# Patient Record
Sex: Female | Born: 1985 | Race: Black or African American | Hispanic: No | Marital: Married | Smoking: Never smoker
Health system: Southern US, Community
[De-identification: ages and names within clinical notes are randomized; demographics above are authoritative.]

## PROBLEM LIST (undated history)

## (undated) DIAGNOSIS — R0602 Shortness of breath: Secondary | ICD-10-CM

## (undated) DIAGNOSIS — I479 Paroxysmal tachycardia, unspecified: Secondary | ICD-10-CM

## (undated) HISTORY — DX: Shortness of breath: R06.02

## (undated) HISTORY — DX: Paroxysmal tachycardia, unspecified: I47.9

---

## 2018-03-04 ENCOUNTER — Emergency Department (HOSPITAL_BASED_OUTPATIENT_CLINIC_OR_DEPARTMENT_OTHER): Payer: 59

## 2018-03-04 ENCOUNTER — Encounter (HOSPITAL_BASED_OUTPATIENT_CLINIC_OR_DEPARTMENT_OTHER): Payer: Self-pay | Admitting: Emergency Medicine

## 2018-03-04 ENCOUNTER — Emergency Department (HOSPITAL_BASED_OUTPATIENT_CLINIC_OR_DEPARTMENT_OTHER)
Admission: EM | Admit: 2018-03-04 | Discharge: 2018-03-04 | Disposition: A | Discharge status: Home / Self Care | Payer: 59 | Attending: Emergency Medicine | Admitting: Emergency Medicine

## 2018-03-04 ENCOUNTER — Other Ambulatory Visit: Payer: Self-pay

## 2018-03-04 DIAGNOSIS — R42 Dizziness and giddiness: Secondary | ICD-10-CM | POA: Diagnosis not present

## 2018-03-04 DIAGNOSIS — R002 Palpitations: Secondary | ICD-10-CM | POA: Diagnosis not present

## 2018-03-04 DIAGNOSIS — R0602 Shortness of breath: Secondary | ICD-10-CM | POA: Diagnosis not present

## 2018-03-04 DIAGNOSIS — R000 Tachycardia, unspecified: Secondary | ICD-10-CM | POA: Diagnosis not present

## 2018-03-04 LAB — CBC
HCT: 40.3 % (ref 36.0–46.0)
Hemoglobin: 13.3 g/dL (ref 12.0–15.0)
MCH: 28.8 pg (ref 26.0–34.0)
MCHC: 33 g/dL (ref 30.0–36.0)
MCV: 87.2 fL (ref 78.0–100.0)
PLATELETS: 311 10*3/uL (ref 150–400)
RBC: 4.62 MIL/uL (ref 3.87–5.11)
RDW: 12.8 % (ref 11.5–15.5)
WBC: 5.8 10*3/uL (ref 4.0–10.5)

## 2018-03-04 LAB — BASIC METABOLIC PANEL
Anion gap: 12 (ref 5–15)
BUN: 14 mg/dL (ref 6–20)
CALCIUM: 9.4 mg/dL (ref 8.9–10.3)
CO2: 20 mmol/L — ABNORMAL LOW (ref 22–32)
CREATININE: 0.82 mg/dL (ref 0.44–1.00)
Chloride: 105 mmol/L (ref 101–111)
GFR calc Af Amer: >60 mL/min (ref >60)
GFR calc non Af Amer: >60 mL/min (ref >60)
GLUCOSE: 124 mg/dL — AB (ref 65–99)
POTASSIUM: 3.7 mmol/L (ref 3.5–5.1)
SODIUM: 137 mmol/L (ref 135–145)

## 2018-03-04 LAB — D-DIMER, QUANTITATIVE (NOT AT ARMC): D-Dimer, Quant: <0.27 ug/mL-FEU (ref 0.00–0.50)

## 2018-03-04 LAB — TROPONIN I

## 2018-03-04 LAB — PREGNANCY, URINE: PREG TEST UR: NEGATIVE

## 2018-03-04 MED ORDER — SODIUM CHLORIDE 0.9 % IV BOLUS (SEPSIS)
500.0000 mL | Freq: Once | INTRAVENOUS | Status: AC
  Administered 2018-03-04: 500 mL via INTRAVENOUS

## 2018-03-04 NOTE — Discharge Instructions (Signed)
Make sure to stay well-hydrated.  Eat regularly to keep your blood sugar up.  Please establish care with a primary care provider for further evaluation and treatment of your symptoms.  Please also follow-up with cardiology.  Please return to the emergency department if you develop any new or worsening symptoms.

## 2018-03-04 NOTE — ED Notes (Signed)
Pt given d/c instructions as per chart. Verbalizes understanding. No questions. 

## 2018-03-04 NOTE — ED Triage Notes (Signed)
Palpitations, generalized weakness, and nausea since yesterday. Denies pain.

## 2018-03-04 NOTE — ED Provider Notes (Addendum)
[facility] EMERGENCY DEPARTMENT Provider Note   CSN: [id number] Arrival date & time: 03/04/18  1631     History   Chief Complaint Chief Complaint  Patient presents with  . Palpitations    HPI Linda Best is a 32 y.o. female who is previously healthy who presents with a 1 day history of intermittent palpitations with associated shortness of breath and nausea.  The patient has had associated fatigue at times.  Patient reports she has had some lightheadedness with the palpitations.  She reports she had an episode like this in December 2018, however resolved without issue.  Patient denies any chest pain, abdominal pain, vomiting.  Patient is not taking any medications at home for symptoms.  She denies any recent long trips, surgeries, known cancer, new leg pain or swelling, history of blood clots.  She denies any exogenous estrogen use.  Patient reports she has been eating little to no carbs for 2 months on the Paleo diet.  She denies caffeine intake.  HPI  History reviewed. No pertinent past medical history.  There are no active problems to display for this patient.   History reviewed. No pertinent surgical history.  OB History    No data available       Home Medications    Prior to Admission medications   Not on File    Family History No family history on file.  Social History Social History   Tobacco Use  . Smoking status: Never Smoker  . Smokeless tobacco: Never Used  Substance Use Topics  . Alcohol use: No    Frequency: Never  . Drug use: No     Allergies   Patient has no known allergies.   Review of Systems Review of Systems  Constitutional: Negative for chills and fever.  HENT: Negative for facial swelling and sore throat.   Respiratory: Positive for shortness of breath.   Cardiovascular: Positive for palpitations. Negative for chest pain.  Gastrointestinal: Positive for nausea. Negative for abdominal pain and vomiting.    Genitourinary: Negative for dysuria.  Musculoskeletal: Negative for back pain.  Skin: Negative for rash and wound.  Neurological: Positive for light-headedness. Negative for headaches.  Psychiatric/Behavioral: The patient is not nervous/anxious.      Physical Exam Updated Vital Signs BP 119/69 (BP Location: Left Arm)   Pulse 88   Temp 99 F (37.2 C) (Oral)   Resp 18   Ht 5\' 7"  (1.702 m)   Wt 73.5 kg (162 lb)   LMP 02/12/2018   SpO2 100%   BMI 25.37 kg/m   Physical Exam  Constitutional: She appears well-developed and well-nourished. No distress.  HENT:  Head: Normocephalic and atraumatic.  Mouth/Throat: Oropharynx is clear and moist. No oropharyngeal exudate.  Eyes: Conjunctivae are normal. Pupils are equal, round, and reactive to light. Right eye exhibits no discharge. Left eye exhibits no discharge. No scleral icterus.  Neck: Normal range of motion. Neck supple. No thyromegaly present.  Cardiovascular: Normal rate, regular rhythm, normal heart sounds and intact distal pulses. Exam reveals no gallop and no friction rub.  No murmur heard. Tachycardia resolved in the ED without intervention  Pulmonary/Chest: Effort normal and breath sounds normal. No stridor. No respiratory distress. She has no wheezes. She has no rales.  Abdominal: Soft. Bowel sounds are normal. She exhibits no distension. There is no tenderness. There is no rebound and no guarding.  Musculoskeletal: She exhibits no edema.  Lymphadenopathy:    She has no cervical adenopathy.  Neurological:  She is alert. Coordination normal.  Skin: Skin is warm and dry. No rash noted. She is not diaphoretic. No pallor.  Psychiatric: She has a normal mood and affect.  Nursing note and vitals reviewed.    ED Treatments / Results  Labs (all labs ordered are listed, but only abnormal results are displayed) Labs Reviewed  BASIC METABOLIC PANEL - Abnormal; Notable for the following components:      Result Value   CO2 20  (*)    Glucose, Bld 124 (*)    All other components within normal limits  CBC  TROPONIN I  PREGNANCY, URINE  D-DIMER, QUANTITATIVE (NOT AT [facility])  TSH    EKG  EKG Interpretation  Date/Time:  Sunday March 04 2018 16:40:25 EDT Ventricular Rate:  133 PR Interval:  136 QRS Duration: 72 QT Interval:  302 QTC Calculation: 449 R Axis:   100 Text Interpretation:  Sinus tachycardia Rightward axis Borderline ECG No previous ECGs available Confirmed by Alvira Monday ([id number]) on 03/04/2018 6:26:06 PM       Radiology Dg Chest 2 View  Result Date: 03/04/2018 CLINICAL DATA:  Tachycardia for 2 days EXAM: CHEST - 2 VIEW COMPARISON:  None. FINDINGS: The heart size and mediastinal contours are within normal limits. Both lungs are clear. The visualized skeletal structures are unremarkable. IMPRESSION: No active cardiopulmonary disease. Electronically Signed   By: Alcide Clever M.D.   On: 03/04/2018 17:21    Procedures Procedures (including critical care time)  Medications Ordered in ED Medications  sodium chloride 0.9 % bolus 500 mL (0 mLs Intravenous Stopped 03/04/18 1924)     Initial Impression / Assessment and Plan / ED Course  I have reviewed the triage vital signs and the nursing notes.  Pertinent labs & imaging results that were available during my care of the patient were reviewed by me and considered in my medical decision making (see chart for details).     Patient with intermittent palpitations with associated shortness of breath and nausea, as well as lightheadedness.  Patient is patient asymptomatic now.  Labs are unremarkable including negative troponin and d-dimer. Upreg negative. Chest x-ray is negative.  TSH is pending, however return for several hours.  Patient given saline bolus.  Patient with normal right throughout most of ED course and at discharge.  Patient referred to cardiology for further evaluation and probable Holter monitor.  Patient also advised to establish care  with PCP, she just moved here.  Patient advised to moderate her diet, as this could be adding to her symptoms.  Return precautions discussed.  Patient understands and agrees with plan.  Patient vitals stable throughout ED course and discharged in satisfactory condition. I discussed patient case with Dr. Dalene Seltzer who guided the patient's management and agrees with plan.   Final Clinical Impressions(s) / ED Diagnoses   Final diagnoses:  Palpitations    ED Discharge Orders    None           Emi Holes, PA-C 03/04/18 1945    Alvira Monday, MD 03/05/18 1131

## 2018-03-04 NOTE — ED Notes (Signed)
ED Provider at bedside. 

## 2018-03-05 LAB — TSH: TSH: 1.179 u[IU]/mL (ref 0.350–4.500)

## 2018-03-12 ENCOUNTER — Ambulatory Visit: Payer: 59 | Admitting: Cardiovascular Disease

## 2018-03-12 ENCOUNTER — Encounter: Payer: Self-pay | Admitting: Cardiovascular Disease

## 2018-03-12 VITALS — BP 130/67 | HR 88 | Ht 67.0 in | Wt 174.0 lb

## 2018-03-12 DIAGNOSIS — R0602 Shortness of breath: Secondary | ICD-10-CM | POA: Diagnosis not present

## 2018-03-12 DIAGNOSIS — R110 Nausea: Secondary | ICD-10-CM | POA: Diagnosis not present

## 2018-03-12 DIAGNOSIS — I479 Paroxysmal tachycardia, unspecified: Secondary | ICD-10-CM

## 2018-03-12 DIAGNOSIS — R000 Tachycardia, unspecified: Secondary | ICD-10-CM

## 2018-03-12 DIAGNOSIS — R42 Dizziness and giddiness: Secondary | ICD-10-CM | POA: Diagnosis not present

## 2018-03-12 HISTORY — DX: Paroxysmal tachycardia, unspecified: I47.9

## 2018-03-12 MED ORDER — METOPROLOL TARTRATE 25 MG PO TABS: 25.0000 mg | ORAL_TABLET | Freq: Two times a day (BID) | ORAL | 3 refills | Status: DC | PRN

## 2018-03-12 NOTE — Patient Instructions (Addendum)
Medication Instructions:  START METOPROLOL TART 25 MG 1 EVERY 12 HOURS AS NEEDED FOR FAST HEARTRATE   Labwork: NONE  Testing/Procedures: Your physician has requested that you have an echocardiogram. Echocardiography is a painless test that uses sound waves to create images of your heart. It provides your doctor with information about the size and shape of your heart and how well your heart's chambers and valves are working. This procedure takes approximately one hour. There are no restrictions for this procedure.  Your physician has recommended that you wear a holter monitor. Holter monitors are medical devices that record the heart's electrical activity. Doctors most often use these monitors to diagnose arrhythmias. Arrhythmias are problems with the speed or rhythm of the heartbeat. The monitor is a small, portable device. You can wear one while you do your normal daily activities. This is usually used to diagnose what is causing palpitations/syncope (passing out).  BOTH ARE DONE AT [facility] [address]  Follow-Up: Your physician recommends that you schedule a follow-up appointment in: 4-8 WEEKS    Any Other Special Instructions Will Be Listed Below (If Applicable).  Echocardiogram An echocardiogram, or echocardiography, uses sound waves (ultrasound) to produce an image of your heart. The echocardiogram is simple, painless, obtained within a short period of time, and offers valuable information to your health care provider. The images from an echocardiogram can provide information such as:  Evidence of coronary artery disease (CAD).  Heart size.  Heart muscle function.  Heart valve function.  Aneurysm detection.  Evidence of a past heart attack.  Fluid buildup around the heart.  Heart muscle thickening.  Assess heart valve function.  Tell a health care provider about:  Any allergies you have.  All medicines you are taking, including vitamins, herbs, eye  drops, creams, and over-the-counter medicines.  Any problems you or family members have had with anesthetic medicines.  Any blood disorders you have.  Any surgeries you have had.  Any medical conditions you have.  Whether you are pregnant or may be pregnant. What happens before the procedure? No special preparation is needed. Eat and drink normally. What happens during the procedure?  In order to produce an image of your heart, gel will be applied to your chest and a wand-like tool (transducer) will be moved over your chest. The gel will help transmit the sound waves from the transducer. The sound waves will harmlessly bounce off your heart to allow the heart images to be captured in real-time motion. These images will then be recorded.  You may need an IV to receive a medicine that improves the quality of the pictures. What happens after the procedure? You may return to your normal schedule including diet, activities, and medicines, unless your health care provider tells you otherwise. This information is not intended to replace advice given to you by your health care provider. Make sure you discuss any questions you have with your health care provider. Document Released: 12/09/2000 Document Revised: 07/30/2016 Document Reviewed: 08/19/2013 Elsevier Interactive Patient Education  2017 Elsevier Inc.    Holter Monitoring A Holter monitor is a small device that is used to detect abnormal heart rhythms. It clips to your clothing and is connected by wires to flat, sticky disks (electrodes) that attach to your chest. It is worn continuously for 24-48 hours. Follow these instructions at home:  Wear your Holter monitor at all times, even while exercising and sleeping, for as long as directed by your health care  provider.  Make sure that the Holter monitor is safely clipped to your clothing or close to your body as recommended by your health care provider.  Do not get the monitor or wires  wet.  Do not put body lotion or moisturizer on your chest.  Keep your skin clean.  Keep a diary of your daily activities, such as walking and doing chores. If you feel that your heartbeat is abnormal or that your heart is fluttering or skipping a beat: ? Record what you are doing when it happens. ? Record what time of day the symptoms occur.  Return your Holter monitor as directed by your health care provider.  Keep all follow-up visits as directed by your health care provider. This is important. Get help right away if:  You feel lightheaded or you faint.  You have trouble breathing.  You feel pain in your chest, upper arm, or jaw.  You feel sick to your stomach and your skin is pale, cool, or damp.  You heartbeat feels unusual or abnormal. This information is not intended to replace advice given to you by your health care provider. Make sure you discuss any questions you have with your health care provider. Document Released: 09/09/2004 Document Revised: 05/19/2016 Document Reviewed: 07/21/2014 Elsevier Interactive Patient Education  [employer]2018 Elsevier Inc.

## 2018-03-12 NOTE — Progress Notes (Signed)
Cardiology Office Note   Date:  03/12/2018   ID:  Linda Best, DOB [birth year], MRN [id number]  PCP:  Clementeen GrahamScifres, Dorothy, PA-C  Cardiologist:   Chilton Siiffany Savannah, MD   Chief Complaint  Patient presents with  . New Patient (Initial Visit)    no chest pain  . Shortness of Breath    randomly  . Dizziness    randomly      History of Present Illness: Linda Best (pronounced "Secure") is a 32 y.o. female who presents for an evaluation of palpitations.  Earlier this month she developed palpitations while exercising.   She has to stop the class and rest.  She was short of breath, nauseous and dizzy.  The episode lasted approximately 10 minutes.  The following day she had a more intense episode that prompted her to go to the emergency department.  EKG revealed sinus tachycardia at 133 bpm. The tachycardia resolved on its own without intervention. Thyroid function, electrolytes, and blood counts were unremarkable. D-dimer was within normal limits.  Buel ReamAlexandra Law, PA-C, referred her to cardiology for further evaluation.  Her energy levels have been very low.  Of note, she started a Paleo diet and has been avoiding carbs and dairy since January.  She has also been trying to eat smaller portions.  Since these episodes she notes that she has been eating more frequently and added back carbs.  She gets dizzy as she does not eat every 2 hours.  This also triggers her heart racing.  She saw her PCP today who provided her with glucose strips in case hypoglycemia could be causing her episodes.  She does not drink any caffeine or alcohol.  She does not use any over-the-counter cold or cough medications.  She has typical work stress but nothing excessive.  She does sometimes struggle with anxiety but notes that she was not feeling particularly anxious prior to these events.  At baseline Linda Best exercises 4-5 times per week.  She typically has no chest pain or shortness of breath.  She has a brother who  had heart disease and was not allowed to participate in sports anymore.  She is not sure if his exact diagnosis but does note that he has a heart monitor in place.  She also has a sister who has heart failure secondary to valvular heart disease.  This was recently diagnosed.  Linda Best has not expands any lower extremity edema, orthopnea, or PND.   Past Medical History:  Diagnosis Date  . Paroxysmal tachycardia (HCC) 03/12/2018    History reviewed. No pertinent surgical history.   Current Outpatient Medications  Medication Sig Dispense Refill  . metoprolol tartrate (LOPRESSOR) 25 MG tablet Take 1 tablet (25 mg total) by mouth every 12 (twelve) hours as needed (FAST HEART RATE). 60 tablet 3   No current facility-administered medications for this visit.     Allergies:   Patient has no known allergies.    Social History:  The patient  reports that  has never smoked. she has never used smokeless tobacco. She reports that she does not drink alcohol or use drugs.   Family History:  The patient's family history includes Diabetes in her maternal grandmother and mother; Diabetes Mellitus I in her sister; Heart disease in her brother; Other in her father; Valvular heart disease in her sister.    ROS:  Please see the history of present illness.   Otherwise, review of systems are positive for none.   All other systems  are reviewed and negative.    PHYSICAL EXAM: VS:  BP 130/67   Pulse 88   Ht 5\' 7"  (1.702 m)   Wt 174 lb (78.9 kg)   LMP 02/12/2018   BMI 27.25 kg/m  , BMI Body mass index is 27.25 kg/m. GENERAL:  Well appearing HEENT:  Pupils equal round and reactive, fundi not visualized, oral mucosa unremarkable NECK:  No jugular venous distention, waveform within normal limits, carotid upstroke brisk and symmetric, no bruits, no thyromegaly LUNGS:  Clear to auscultation bilaterally HEART:  RRR.  PMI not displaced or sustained, prominent  S1 and normal S2, no S3, no S4, no clicks, no  rubs, Intermittent I/VI systolic murmur at the LUSB that doesn't augment with Valsalva or hand grip ABD:  Flat, positive bowel sounds normal in frequency in pitch, no bruits, no rebound, no guarding, no midline pulsatile mass, no hepatomegaly, no splenomegaly EXT:  2 plus pulses throughout, no edema, no cyanosis no clubbing SKIN:  No rashes no nodules NEURO:  Cranial nerves II through XII grossly intact, motor grossly intact throughout PSYCH:  Cognitively intact, oriented to person place and time   EKG:  EKG is not ordered today. The ekg ordered 03/04/18 demonstrates sinus tachycardia.  Rate 133 bpm.  Recent Labs: 03/04/2018: BUN 14; Creatinine, Ser 0.82; Hemoglobin 13.3; Platelets 311; Potassium 3.7; Sodium 137; TSH 1.179    Lipid Panel No results found for: CHOL, TRIG, HDL, CHOLHDL, VLDL, LDLCALC, LDLDIRECT    Wt Readings from Last 3 Encounters:  03/12/18 174 lb (78.9 kg)  03/04/18 162 lb (73.5 kg)      ASSESSMENT AND PLAN:  # Tachycardia:  # Dizziness: Cause unclear.  She isn't stressed but has mild anxiety.  Thyroid function, blood counts and electrolytes are normal.  She doesn't consume caffeine.  We will get a 24 hour Holter to better assess the frequency and how faster her HR is.  Start metoprolol 12.5mg  prn.  Agree with checking blood glucose levels to assess for hypoglycemia.  # Family history of heart failure: # Murmur:  # Shortness of breath: Linda Best had an intermittent murmur on exam.  Unclear what her brother's diagnosis is and she will investigate.  We will get an echo to better assess.    Current medicines are reviewed at length with the patient today.  The patient does not have concerns regarding medicines.  The following changes have been made:  Metoprolol prn  Labs/ tests ordered today include:   Orders Placed This Encounter  Procedures  . Holter monitor - 24 hour  . ECHOCARDIOGRAM COMPLETE     Disposition:   FU with Merrilee Ancona C. Duke Salvia, MD, [facility]  in 6 weeks.     This note was written with the assistance of speech recognition software.  Please excuse any transcriptional errors.  Signed, Jekhi Bolin C. Duke Salvia, MD, [facility]  03/12/2018 4:34 PM    [facility]

## 2018-03-27 ENCOUNTER — Other Ambulatory Visit: Payer: Self-pay

## 2018-03-27 ENCOUNTER — Ambulatory Visit (HOSPITAL_COMMUNITY): Payer: 59 | Attending: Cardiovascular Disease

## 2018-03-27 ENCOUNTER — Ambulatory Visit (INDEPENDENT_AMBULATORY_CARE_PROVIDER_SITE_OTHER): Payer: 59

## 2018-03-27 DIAGNOSIS — R0602 Shortness of breath: Secondary | ICD-10-CM | POA: Diagnosis not present

## 2018-03-27 DIAGNOSIS — I071 Rheumatic tricuspid insufficiency: Secondary | ICD-10-CM | POA: Diagnosis not present

## 2018-03-27 DIAGNOSIS — R000 Tachycardia, unspecified: Secondary | ICD-10-CM | POA: Insufficient documentation

## 2018-03-27 DIAGNOSIS — Z8249 Family history of ischemic heart disease and other diseases of the circulatory system: Secondary | ICD-10-CM | POA: Insufficient documentation

## 2018-04-11 ENCOUNTER — Telehealth: Payer: Self-pay | Admitting: Cardiovascular Disease

## 2018-04-11 NOTE — Telephone Encounter (Signed)
NEW MESSAGE ° ° °Patient calling for echo results. °

## 2018-04-11 NOTE — Telephone Encounter (Signed)
Left detailed message, ok per DPR Echo normal per Dr Duke Salviaandolph.

## 2018-05-03 ENCOUNTER — Ambulatory Visit: Payer: 59 | Admitting: Cardiovascular Disease

## 2018-05-03 ENCOUNTER — Encounter: Payer: Self-pay | Admitting: Cardiovascular Disease

## 2018-05-03 VITALS — BP 120/78 | HR 80 | Ht 67.0 in | Wt 186.0 lb

## 2018-05-03 DIAGNOSIS — R0602 Shortness of breath: Secondary | ICD-10-CM | POA: Diagnosis not present

## 2018-05-03 DIAGNOSIS — R000 Tachycardia, unspecified: Secondary | ICD-10-CM | POA: Diagnosis not present

## 2018-05-03 HISTORY — DX: Shortness of breath: R06.02

## 2018-05-03 NOTE — Patient Instructions (Addendum)
Medication Instructions:  NO CHANGES   Labwork: NONE   Testing/Procedures: NONE   Follow-Up: AS NEEDED  

## 2018-05-03 NOTE — Progress Notes (Signed)
Cardiology Office Note   Date:  05/03/2018   ID:  Linda Best, DOB 30-Aug-1986, MRN [id number]  PCP:  Clementeen Graham, PA-C  Cardiologist:   Chilton Si, MD   No chief complaint on file.     History of Present Illness: Linda Best (pronounced "Secure") is a 32 y.o. female who presents for follow-up.  She was initially seen 02/2018 for an evaluation of palpitations.  She initially developed palpitations while exercising.   She has to stop the class and rest.  She was short of breath, nauseous and dizzy.  The episode lasted approximately 10 minutes.  The following day she had a more intense episode that prompted her to go to the emergency department.  EKG revealed sinus tachycardia at 133 bpm. The tachycardia resolved on its own without intervention. Thyroid function, electrolytes, and blood counts were unremarkable. D-dimer was within normal limits.  Buel Ream, PA-C, referred her to cardiology for further evaluation.  Her energy levels have been very low.  Of note, she started a Paleo diet and has been avoiding carbs and dairy since January.  She has also been trying to eat smaller portions. At her appointment she was noted to have a systolic murmur.  She was referred for an echo that revealed LVEF 60 to 65% and was otherwise unremarkable.  She also reported having palpitations and tachycardia with exercise.  She was referred for a 24-hour Holter that showed no arrhythmias.  Since her last appointment [name] has been doing well.  She hasn't had any recurrent episodes of tachycardia.  She has not been exercising lately.  She was concerned that her symptoms may be due to hypoglycemia so she has been trying to incorporate more carbs into her diet.  Since her last appointment she is gained 12 pounds.  She has not had any chest pain.  But she does have some episodes of shortness of breath.  These typically occur when she is sitting at rest doing nothing.  She does sometimes feel  short of breath when walking upstairs.  She has no lower extremity edema, orthopnea, or PND.  She never picked up the metoprolol prescription and therefore has not used it.  She does not recall having any palpitations when she was wearing her monitor.  She does note that she has been anxious lately and stressed with work.   Past Medical History:  Diagnosis Date  . Paroxysmal tachycardia (HCC) 03/12/2018  . Shortness of breath 05/03/2018    History reviewed. No pertinent surgical history.   No current outpatient medications on file.   No current facility-administered medications for this visit.     Allergies:   Patient has no known allergies.    Social History:  The patient  reports that she has never smoked. She has never used smokeless tobacco. She reports that she does not drink alcohol or use drugs.   Family History:  The patient's family history includes Diabetes in her maternal grandmother and mother; Diabetes Mellitus I in her sister; Heart disease in her brother; Other in her father; Valvular heart disease in her sister.    ROS:  Please see the history of present illness.   Otherwise, review of systems are positive for none.   All other systems are reviewed and negative.    PHYSICAL EXAM: VS:  BP 120/78   Pulse 80   Ht  (1.702 m)   Wt 186 lb (84.4 kg)   BMI 29.13 kg/m  , BMI Body  mass index is 29.13 kg/m. GENERAL:  Well appearing.  No acute distress.  HEENT: Pupils equal round and reactive, fundi not visualized, oral mucosa unremarkable NECK:  No jugular venous distention, waveform within normal limits, carotid upstroke brisk and symmetric, no bruits, no thyromegaly LYMPHATICS:  No cervical adenopathy LUNGS:  Clear to auscultation bilaterally HEART:  RRR.  PMI not displaced or sustained,S1 and S2 within normal limits, no S3, no S4, no clicks, no rubs, no murmurs ABD:  Flat, positive bowel sounds normal in frequency in pitch, no bruits, no rebound, no guarding, no  midline pulsatile mass, no hepatomegaly, no splenomegaly EXT:  2 plus pulses throughout, no edema, no cyanosis no clubbing SKIN:  No rashes no nodules NEURO:  Cranial nerves II through XII grossly intact, motor grossly intact throughout PSYCH:  Cognitively intact, oriented to person place and time   EKG:  EKG is not ordered today. The ekg ordered 03/04/18 demonstrates sinus tachycardia.  Rate 133 bpm.  Echo 03/27/2018:   Echo 03/27/2018: Study Conclusions  - Left ventricle: The cavity size was normal. Systolic function was   normal. The estimated ejection fraction was in the range of 60%   to 65%. Wall motion was normal; there were no regional wall   motion abnormalities. Left ventricular diastolic function   parameters were normal. - Aortic valve: There was no regurgitation. - Mitral valve: Transvalvular velocity was within the normal range.   There was no evidence for stenosis. There was trivial   regurgitation. - Right ventricle: The cavity size was normal. Wall thickness was   normal. Systolic function was normal. - Tricuspid valve: There was mild regurgitation. - Pulmonary arteries: Systolic pressure was within the normal   range. PA peak pressure: 26 mm Hg (S).  24 Hour Holter Monitor  03/27/2018: Quality: Fair.  Baseline artifact. Average heart rate: 90 bpm Max heart rate: 142 bpm Min heart rate: 59 bpm  Sinus rhythm, sinus arrhythmia and sinus tachycardia No arrhythmias noted.   Recent Labs: 03/04/2018: BUN 14; Creatinine, Ser 0.82; Hemoglobin 13.3; Platelets 311; Potassium 3.7; Sodium 137; TSH 1.179    Lipid Panel No results found for: CHOL, TRIG, HDL, CHOLHDL, VLDL, LDLCALC, LDLDIRECT    Wt Readings from Last 3 Encounters:  05/03/18 186 lb (84.4 kg)  03/12/18 174 lb (78.9 kg)  03/04/18 162 lb (73.5 kg)      ASSESSMENT AND PLAN:  # Tachycardia:  # Dizziness: Likely dietary related.  She was on a strict Paleo diet.  It is occurring mostly at rest.  Her  symptoms have improved.  Recommended a Mediterranean Diet and high fluid intake.   # Family history of heart failure: # Murmur:  # Shortness of breath: No murmur on exam.  Echo unremarkable.  Shortness of breath at rest is more related to anxiety than a cardiopulmonary process.   # CV Disease Prevention:  Recommended getting back into her exercise routine and starting Mediterranean Diet.   Current medicines are reviewed at length with the patient today.  The patient does not have concerns regarding medicines.  The following changes have been made:  None.  Labs/ tests ordered today include:   No orders of the defined types were placed in this encounter.    Disposition:   FU with Lavanya Roa C. Duke Salvia, MD, [facility] as needed.      Signed, Gregor Dershem C. Duke Salvia, MD, [facility]  05/03/2018 8:51 AM    [facility]

## 2018-06-27 DIAGNOSIS — R5383 Other fatigue: Secondary | ICD-10-CM | POA: Diagnosis not present

## 2018-08-07 DIAGNOSIS — E559 Vitamin D deficiency, unspecified: Secondary | ICD-10-CM | POA: Diagnosis not present

## 2018-08-07 DIAGNOSIS — R42 Dizziness and giddiness: Secondary | ICD-10-CM | POA: Diagnosis not present

## 2018-08-07 DIAGNOSIS — R0602 Shortness of breath: Secondary | ICD-10-CM | POA: Diagnosis not present

## 2018-11-05 DIAGNOSIS — R7303 Prediabetes: Secondary | ICD-10-CM | POA: Diagnosis not present

## 2018-11-05 DIAGNOSIS — R0602 Shortness of breath: Secondary | ICD-10-CM | POA: Diagnosis not present

## 2018-11-05 DIAGNOSIS — K219 Gastro-esophageal reflux disease without esophagitis: Secondary | ICD-10-CM | POA: Diagnosis not present

## 2018-11-15 ENCOUNTER — Other Ambulatory Visit: Payer: Self-pay | Admitting: Gastroenterology

## 2018-11-15 DIAGNOSIS — R1013 Epigastric pain: Secondary | ICD-10-CM

## 2018-11-19 ENCOUNTER — Other Ambulatory Visit: Payer: 59

## 2018-11-26 DIAGNOSIS — K319 Disease of stomach and duodenum, unspecified: Secondary | ICD-10-CM | POA: Diagnosis not present

## 2018-11-26 DIAGNOSIS — R1013 Epigastric pain: Secondary | ICD-10-CM | POA: Diagnosis not present

## 2018-11-26 DIAGNOSIS — K2930 Chronic superficial gastritis without bleeding: Secondary | ICD-10-CM | POA: Diagnosis not present

## 2018-12-06 DIAGNOSIS — R05 Cough: Secondary | ICD-10-CM | POA: Diagnosis not present

## 2018-12-11 DIAGNOSIS — A084 Viral intestinal infection, unspecified: Secondary | ICD-10-CM | POA: Diagnosis not present

## 2019-03-22 DIAGNOSIS — H8110 Benign paroxysmal vertigo, unspecified ear: Secondary | ICD-10-CM | POA: Diagnosis not present

## 2019-05-13 IMAGING — DX DG CHEST 2V
2 series · 2 of 2 positions shown · non-contrast
Comparison: None.

CLINICAL DATA: Tachycardia for 2 days

EXAM:
CHEST - 2 VIEW

[chest pa]
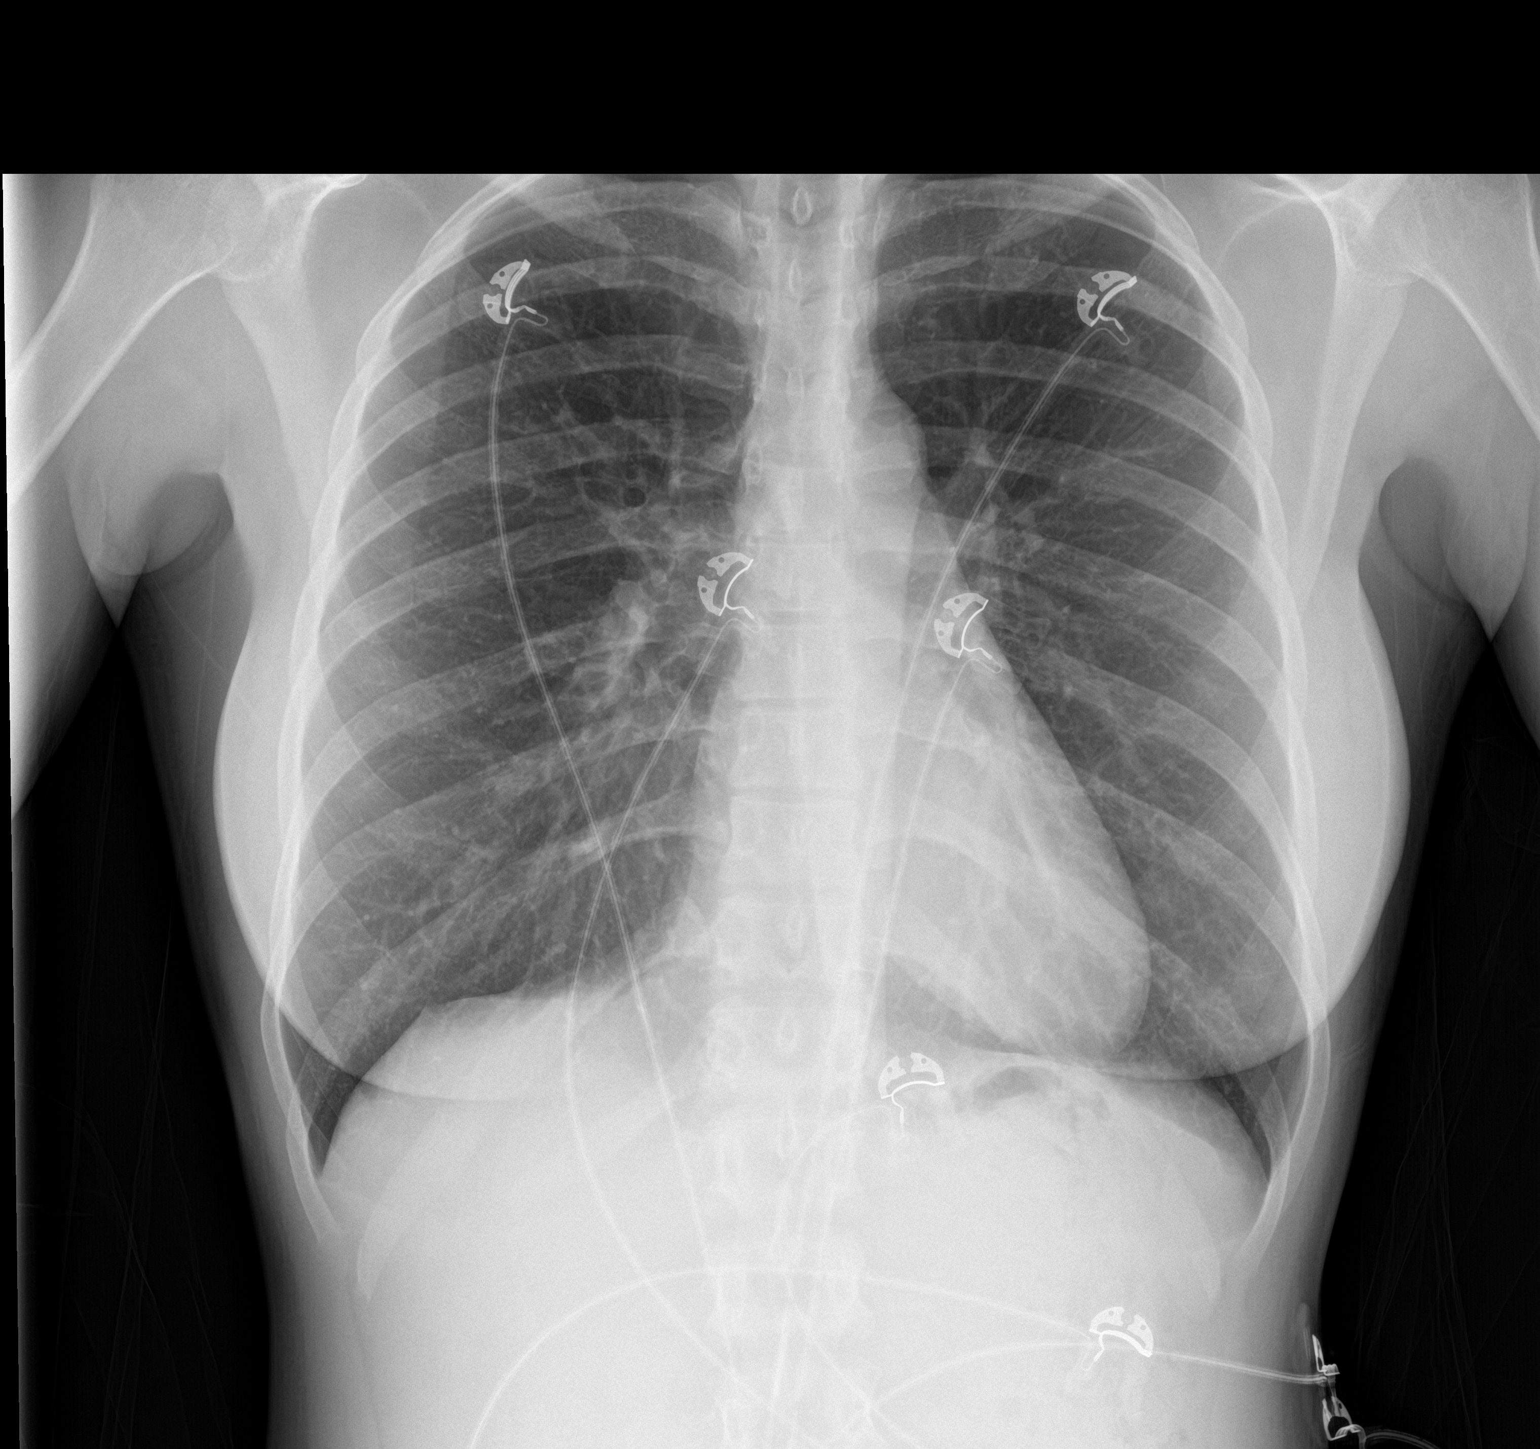

[chest lat]
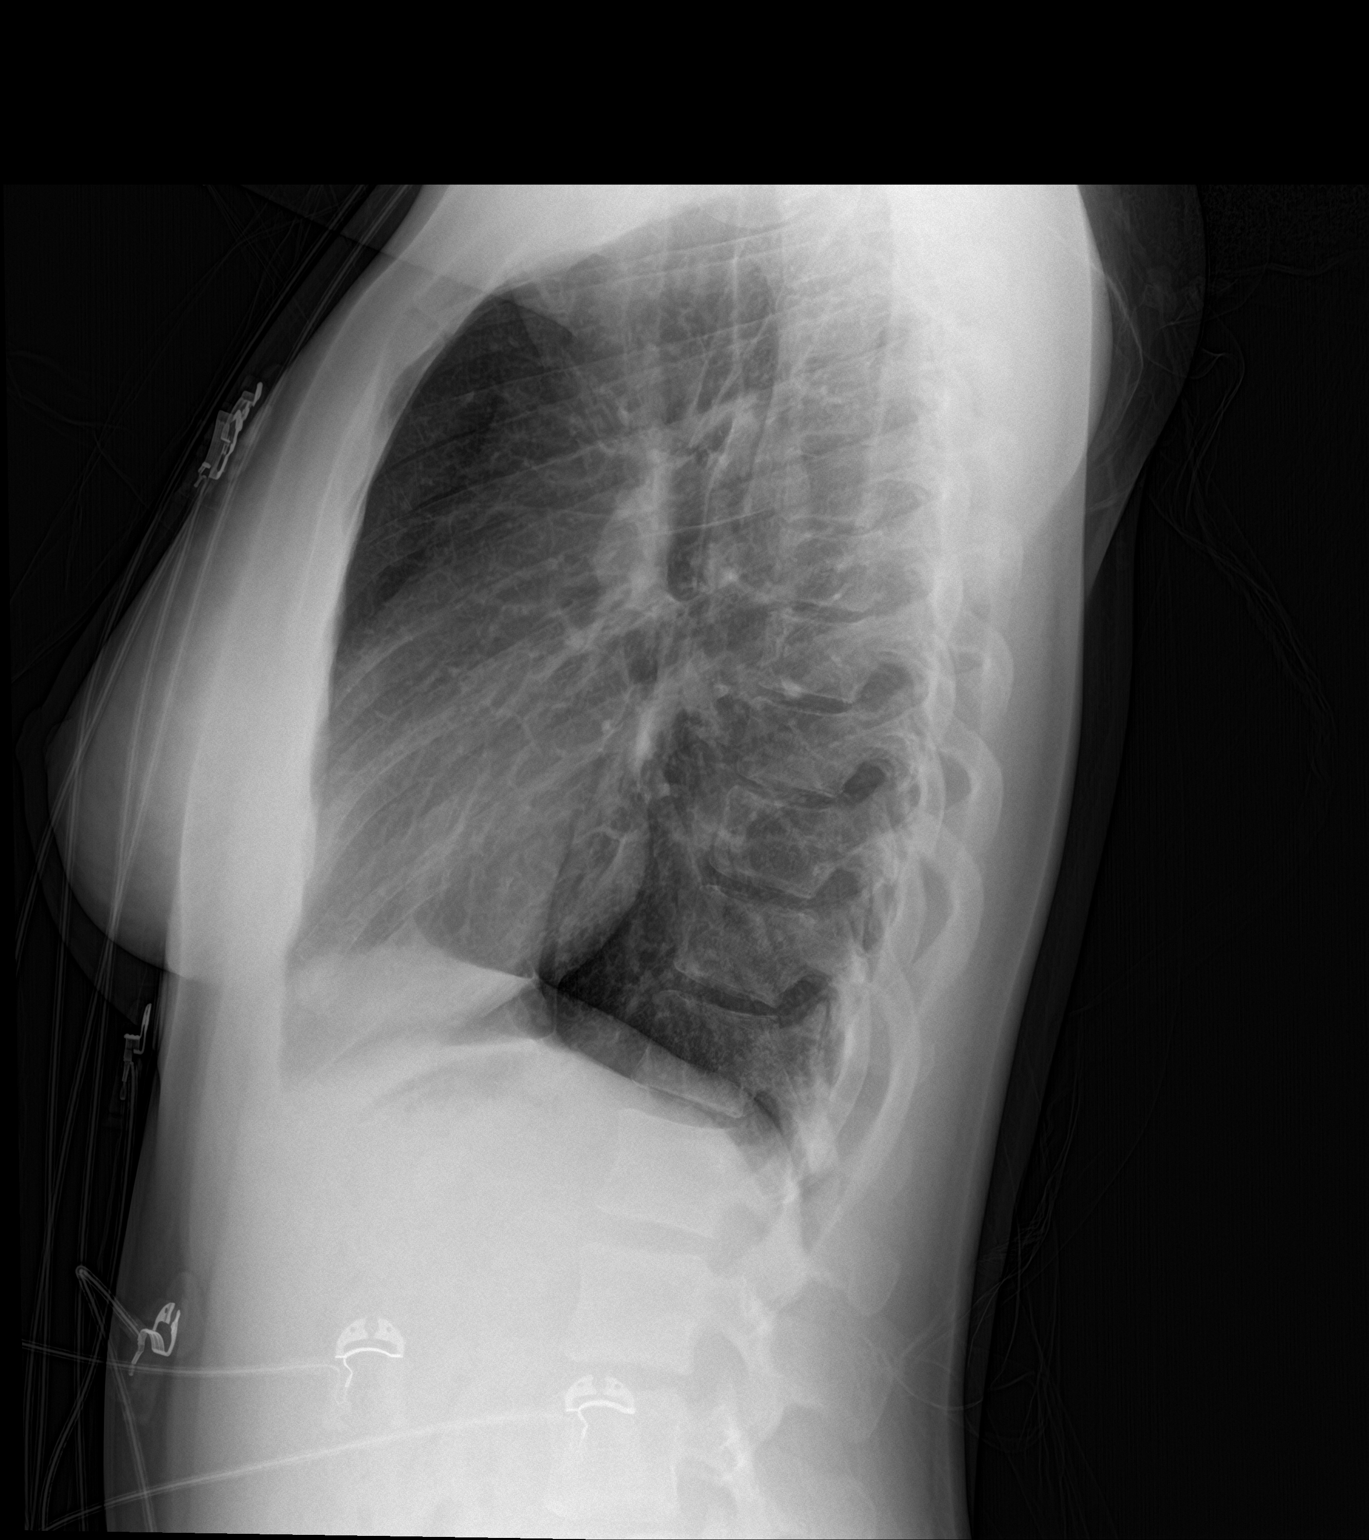

[2 of 2 positions shown; findings below may reference images not displayed]

FINDINGS: The heart size and mediastinal contours are within normal limits.
Both lungs are clear. The visualized skeletal structures are
unremarkable.
IMPRESSION: No active cardiopulmonary disease.

## 2019-07-05 DIAGNOSIS — Z3201 Encounter for pregnancy test, result positive: Secondary | ICD-10-CM | POA: Diagnosis not present

## 2019-07-26 DIAGNOSIS — Z3400 Encounter for supervision of normal first pregnancy, unspecified trimester: Secondary | ICD-10-CM | POA: Diagnosis not present

## 2019-07-28 LAB — OB RESULTS CONSOLE GC/CHLAMYDIA
Chlamydia: NEGATIVE
Gonorrhea: NEGATIVE

## 2019-07-28 LAB — OB RESULTS CONSOLE RUBELLA ANTIBODY, IGM: Rubella: IMMUNE

## 2019-07-28 LAB — OB RESULTS CONSOLE GBS: GBS: NEGATIVE

## 2019-07-28 LAB — OB RESULTS CONSOLE HIV ANTIBODY (ROUTINE TESTING): HIV: NONREACTIVE

## 2019-08-09 DIAGNOSIS — Z3401 Encounter for supervision of normal first pregnancy, first trimester: Secondary | ICD-10-CM | POA: Diagnosis not present

## 2019-08-09 DIAGNOSIS — O26891 Other specified pregnancy related conditions, first trimester: Secondary | ICD-10-CM | POA: Diagnosis not present

## 2019-08-14 DIAGNOSIS — Z1159 Encounter for screening for other viral diseases: Secondary | ICD-10-CM | POA: Diagnosis not present

## 2019-08-16 DIAGNOSIS — Z3400 Encounter for supervision of normal first pregnancy, unspecified trimester: Secondary | ICD-10-CM | POA: Diagnosis not present

## 2019-08-16 DIAGNOSIS — Z3401 Encounter for supervision of normal first pregnancy, first trimester: Secondary | ICD-10-CM | POA: Diagnosis not present

## 2019-12-27 NOTE — L&D Delivery Note (Signed)
Operative Delivery Note At 6:45 AM a viable female was delivered via Vaginal, [employer], operational).  Presentation: vertex; Position: Left,, Occiput,, Anterior; Station: +3.  Verbal consent: obtained from patient.  Risks and benefits discussed in detail.  Risks include, but are not limited to the risks of anesthesia, bleeding, infection, damage to maternal tissues, fetal cephalhematoma.  There is also the risk of inability to effect vaginal delivery of the head, or shoulder dystocia that cannot be resolved by established maneuvers, leading to the need for emergency cesarean section.  Bell placed- one pull followed by delivery  APGAR: 8, 9; weight 6 lb 4.9 oz (2860 g).   Placenta status: to path .   Cord:  with the following complications: .  Cord pH: n/a  Anesthesia:  epidural Instruments: Bell Episiotomy: None Lacerations: 2nd degree;Perineal Suture Repair: 2.0 3.0 vicryl 4-0 vicryl Est. Blood Loss (mL): 203  Mom to postpartum.  Baby to Couplet care / Skin to Skin.  Sharon Seller 02/27/2020, 8:53 AM

## 2020-02-18 ENCOUNTER — Encounter (HOSPITAL_COMMUNITY): Payer: Self-pay | Admitting: Obstetrics & Gynecology

## 2020-02-18 ENCOUNTER — Inpatient Hospital Stay (HOSPITAL_COMMUNITY)
Admission: AD | Admit: 2020-02-18 | Discharge: 2020-02-18 | Disposition: A | Discharge status: Home / Self Care | Payer: Managed Care, Other (non HMO) | Attending: Obstetrics & Gynecology | Admitting: Obstetrics & Gynecology

## 2020-02-18 ENCOUNTER — Other Ambulatory Visit: Payer: Self-pay

## 2020-02-18 DIAGNOSIS — O479 False labor, unspecified: Secondary | ICD-10-CM

## 2020-02-18 DIAGNOSIS — O471 False labor at or after 37 completed weeks of gestation: Secondary | ICD-10-CM | POA: Diagnosis present

## 2020-02-18 DIAGNOSIS — Z3A38 38 weeks gestation of pregnancy: Secondary | ICD-10-CM

## 2020-02-18 NOTE — Discharge Instructions (Signed)
Fetal Movement Counts Patient Name: ________________________________________________ Patient Due Date: ____________________ What is a fetal movement count?  A fetal movement count is the number of times that you feel your baby move during a certain amount of time. This may also be called a fetal kick count. A fetal movement count is recommended for every pregnant woman. You may be asked to start counting fetal movements as early as week 28 of your pregnancy. Pay attention to when your baby is most active. You may notice your baby's sleep and wake cycles. You may also notice things that make your baby move more. You should do a fetal movement count:  When your baby is normally most active.  At the same time each day. A good time to count movements is while you are resting, after having something to eat and drink. How do I count fetal movements? 1. Find a quiet, comfortable area. Sit, or lie down on your side. 2. Write down the date, the start time and stop time, and the number of movements that you felt between those two times. Take this information with you to your health care visits. 3. Write down your start time when you feel the first movement. 4. Count kicks, flutters, swishes, rolls, and jabs. You should feel at least 10 movements. 5. You may stop counting after you have felt 10 movements, or if you have been counting for 2 hours. Write down the stop time. 6. If you do not feel 10 movements in 2 hours, contact your health care provider for further instructions. Your health care provider may want to do additional tests to assess your baby's well-being. Contact a health care provider if:  You feel fewer than 10 movements in 2 hours.  Your baby is not moving like he or she usually does. Date: ____________ Start time: ____________ Stop time: ____________ Movements: ____________ Date: ____________ Start time: ____________ Stop time: ____________ Movements: ____________ Date: ____________  Start time: ____________ Stop time: ____________ Movements: ____________ Date: ____________ Start time: ____________ Stop time: ____________ Movements: ____________ Date: ____________ Start time: ____________ Stop time: ____________ Movements: ____________ Date: ____________ Start time: ____________ Stop time: ____________ Movements: ____________ Date: ____________ Start time: ____________ Stop time: ____________ Movements: ____________ Date: ____________ Start time: ____________ Stop time: ____________ Movements: ____________ Date: ____________ Start time: ____________ Stop time: ____________ Movements: ____________ This information is not intended to replace advice given to you by your health care provider. Make sure you discuss any questions you have with your health care provider. Document Revised: 08/01/2019 Document Reviewed: 08/01/2019 Elsevier Patient Education  2020 Else[employer] Braxton Hicks Contractions Contractions of the uterus can occur throughout pregnancy, but they are not always a sign that you are in labor. You may have practice contractions called Braxton Hicks contractions. These false labor contractions are sometimes confused with true labor. What are Braxton Hicks contractions? Braxton Hicks contractions are tightening movements that occur in the muscles of the uterus before labor. Unlike true labor contractions, these contractions do not result in opening (dilation) and thinning of the cervix. Toward the end of pregnancy (32-34 weeks), Braxton Hicks contractions can happen more often and may become stronger. These contractions are sometimes difficult to tell apart from true labor because they can be very uncomfortable. You should not feel embarrassed if you go to the hospital with false labor. Sometimes, the only way to tell if you are in true labor is for your health care provider to look for changes in the cervix. The health care provider   will do a physical exam and may  monitor your contractions. If you are not in true labor, the exam should show that your cervix is not dilating and your water has not broken. If there are no other health problems associated with your pregnancy, it is completely safe for you to be sent home with false labor. You may continue to have Braxton Hicks contractions until you go into true labor. How to tell the difference between true labor and false labor True labor  Contractions last 30-70 seconds.  Contractions become very regular.  Discomfort is usually felt in the top of the uterus, and it spreads to the lower abdomen and low back.  Contractions do not go away with walking.  Contractions usually become more intense and increase in frequency.  The cervix dilates and gets thinner. False labor  Contractions are usually shorter and not as strong as true labor contractions.  Contractions are usually irregular.  Contractions are often felt in the front of the lower abdomen and in the groin.  Contractions may go away when you walk around or change positions while lying down.  Contractions get weaker and are shorter-lasting as time goes on.  The cervix usually does not dilate or become thin. Follow these instructions at home:   Take over-the-counter and prescription medicines only as told by your health care provider.  Keep up with your usual exercises and follow other instructions from your health care provider.  Eat and drink lightly if you think you are going into labor.  If Braxton Hicks contractions are making you uncomfortable: ? Change your position from lying down or resting to walking, or change from walking to resting. ? Sit and rest in a tub of warm water. ? Drink enough fluid to keep your urine pale yellow. Dehydration may cause these contractions. ? Do slow and deep breathing several times an hour.  Keep all follow-up prenatal visits as told by your health care provider. This is important. Contact a  health care provider if:  You have a fever.  You have continuous pain in your abdomen. Get help right away if:  Your contractions become stronger, more regular, and closer together.  You have fluid leaking or gushing from your vagina.  You pass blood-tinged mucus (bloody show).  You have bleeding from your vagina.  You have low back pain that you never had before.  You feel your baby's head pushing down and causing pelvic pressure.  Your baby is not moving inside you as much as it used to. Summary  Contractions that occur before labor are called Braxton Hicks contractions, false labor, or practice contractions.  Braxton Hicks contractions are usually shorter, weaker, farther apart, and less regular than true labor contractions. True labor contractions usually become progressively stronger and regular, and they become more frequent.  Manage discomfort from Braxton Hicks contractions by changing position, resting in a warm bath, drinking plenty of water, or practicing deep breathing. This information is not intended to replace advice given to you by your health care provider. Make sure you discuss any questions you have with your health care provider. Document Revised: 11/24/2017 Document Reviewed: 04/27/2017 Elsevier Patient Education  2020 Elsevier Inc.  

## 2020-02-18 NOTE — MAU Note (Signed)
Patient states she felt a "tightening" in her uterus x1 every hour since last night.  Not painful, just tight.  "I think it might be contractions." Denies LOF or vaginal bleeding.  Endorses good fetal movement.  No problems with pregnancy.

## 2020-02-25 ENCOUNTER — Inpatient Hospital Stay (HOSPITAL_COMMUNITY)
Admission: AD | Admit: 2020-02-25 | Discharge: 2020-02-29 | DRG: 807 | Disposition: A | Discharge status: Home / Self Care | Payer: Managed Care, Other (non HMO) | Attending: Obstetrics & Gynecology | Admitting: Obstetrics & Gynecology

## 2020-02-25 ENCOUNTER — Other Ambulatory Visit: Payer: Self-pay

## 2020-02-25 ENCOUNTER — Encounter (HOSPITAL_COMMUNITY): Payer: Self-pay | Admitting: Obstetrics and Gynecology

## 2020-02-25 DIAGNOSIS — O99824 Streptococcus B carrier state complicating childbirth: Secondary | ICD-10-CM | POA: Diagnosis present

## 2020-02-25 DIAGNOSIS — Z20822 Contact with and (suspected) exposure to covid-19: Secondary | ICD-10-CM | POA: Diagnosis present

## 2020-02-25 DIAGNOSIS — O26893 Other specified pregnancy related conditions, third trimester: Secondary | ICD-10-CM | POA: Diagnosis present

## 2020-02-25 DIAGNOSIS — Z3A39 39 weeks gestation of pregnancy: Secondary | ICD-10-CM

## 2020-02-25 LAB — CBC
HCT: 37.9 % (ref 36.0–46.0)
Hemoglobin: 12.4 g/dL (ref 12.0–15.0)
MCH: 29.4 pg (ref 26.0–34.0)
MCHC: 32.7 g/dL (ref 30.0–36.0)
MCV: 89.8 fL (ref 80.0–100.0)
Platelets: 265 10*3/uL (ref 150–400)
RBC: 4.22 MIL/uL (ref 3.87–5.11)
RDW: 13.2 % (ref 11.5–15.5)
WBC: 10.8 10*3/uL — ABNORMAL HIGH (ref 4.0–10.5)
nRBC: 0 % (ref 0.0–0.2)

## 2020-02-25 MED ORDER — OXYCODONE-ACETAMINOPHEN 5-325 MG PO TABS: 2.0000 | ORAL_TABLET | ORAL | Status: DC | PRN

## 2020-02-25 MED ORDER — OXYTOCIN 40 UNITS IN NORMAL SALINE INFUSION - SIMPLE MED
2.5000 [IU]/h | INTRAVENOUS | Status: DC
  Administered 2020-02-27: 2.5 [IU]/h via INTRAVENOUS
  Filled 2020-02-25: qty 1000

## 2020-02-25 MED ORDER — LACTATED RINGERS IV SOLN: INTRAVENOUS | Status: DC

## 2020-02-25 MED ORDER — ACETAMINOPHEN 325 MG PO TABS
650.0000 mg | ORAL_TABLET | ORAL | Status: DC | PRN
  Administered 2020-02-26: 650 mg via ORAL
  Filled 2020-02-25: qty 2

## 2020-02-25 MED ORDER — OXYTOCIN BOLUS FROM INFUSION
500.0000 mL | Freq: Once | INTRAVENOUS | Status: AC
  Administered 2020-02-27: 500 mL via INTRAVENOUS

## 2020-02-25 MED ORDER — SOD CITRATE-CITRIC ACID 500-334 MG/5ML PO SOLN: 30.0000 mL | ORAL | Status: DC | PRN

## 2020-02-25 MED ORDER — LIDOCAINE HCL (PF) 1 % IJ SOLN: 30.0000 mL | INTRAMUSCULAR | Status: DC | PRN

## 2020-02-25 MED ORDER — OXYCODONE-ACETAMINOPHEN 5-325 MG PO TABS: 1.0000 | ORAL_TABLET | ORAL | Status: DC | PRN

## 2020-02-25 MED ORDER — LACTATED RINGERS IV SOLN: 500.0000 mL | INTRAVENOUS | Status: DC | PRN

## 2020-02-25 MED ORDER — ONDANSETRON HCL 4 MG/2ML IJ SOLN
4.0000 mg | Freq: Four times a day (QID) | INTRAMUSCULAR | Status: DC | PRN
  Filled 2020-02-25: qty 2

## 2020-02-25 NOTE — MAU Note (Signed)
PT SAYS  WAS 3 CM IN OFFICE  TODAY . UC'S - Q4-9 MIN APART.  DENIES HSV AND MRSA. GBS- NEG

## 2020-02-25 NOTE — H&P (Signed)
Linda Best is a 34 y.o. female, G1P0000, IUP at 39.1 weeks [facility] pt of Dr , presenting for normal spontaneous latent labor, Intact, GBS-. Unremarkable PNC.Marland Kitchen Pt endorse + Fm. Denies vaginal leakage. Denies vaginal bleeding.     Patient Active Problem List   Diagnosis Date Noted  . Shortness of breath 05/03/2018  . Paroxysmal tachycardia ([place]) 03/12/2018     Medications Prior to Admission  Medication Sig Dispense Refill Last Dose  . Prenatal Vit-Fe Fumarate-FA (MULTIVITAMIN-PRENATAL) 27-0.8 MG TABS tablet Take 1 tablet by mouth daily at 12 noon.   02/25/2020 at Unknown time    Past Medical History:  Diagnosis Date  . Paroxysmal tachycardia ([place]) 03/12/2018  . Shortness of breath 05/03/2018     No current facility-administered medications on file prior to encounter.   Current Outpatient Medications on File Prior to Encounter  Medication Sig Dispense Refill  . Prenatal Vit-Fe Fumarate-FA (MULTIVITAMIN-PRENATAL) 27-0.8 MG TABS tablet Take 1 tablet by mouth daily at 12 noon.       No Known Allergies  OB History    Gravida  1   Para      Term      Preterm      AB      Living        SAB      TAB      Ectopic      Multiple      Live Births             Past Medical History:  Diagnosis Date  . Paroxysmal tachycardia ([place]) 03/12/2018  . Shortness of breath 05/03/2018   History reviewed. No pertinent surgical history. Family History: family history includes Diabetes in her maternal grandmother and mother; Diabetes Mellitus I in her sister; Heart disease in her brother; Other in her father; Valvular heart disease in her sister. Social History:  reports that she has never smoked. She has never used smokeless tobacco. She reports that she does not drink alcohol or use drugs.   Prenatal Transfer Tool  Maternal Diabetes: No Genetic Screening: Normal Maternal Ultrasounds/Referrals: Normal Fetal Ultrasounds or other Referrals:  None Maternal Substance Abuse:   No Significant Maternal Medications:  None Significant Maternal Lab Results: Group B Strep negative  ROS:  Review of Systems  Constitutional: Negative.   HENT: Negative.   Eyes: Negative.   Respiratory: Negative.   Cardiovascular: Negative.   Gastrointestinal: Positive for abdominal pain.  Genitourinary: Negative.   Musculoskeletal: Negative.   Skin: Negative.   Neurological: Negative.   Endo/Heme/Allergies: Negative.   Psychiatric/Behavioral: Negative.      Physical Exam: BP (!) 118/59 (BP Location: Right Arm)   Pulse 87   Temp 98.8 F (37.1 C) (Oral)   Resp 20   Ht 5\' 7"  (1.702 m)   Wt 72.8 kg   BMI 25.12 kg/m   Physical Exam  Constitutional: She is oriented to person, place, and time and well-developed, well-nourished, and in no distress.  HENT:  Head: Normocephalic and atraumatic.  Eyes: Pupils are equal, round, and reactive to light. Conjunctivae are normal.  Cardiovascular: Normal rate and regular rhythm.  Pulmonary/Chest: Effort normal and breath sounds normal.  Abdominal: Soft. Bowel sounds are normal.  Genitourinary:    Genitourinary Comments: Pelvis adequate, uterus soft non-tender, gravida equal to dates.    Musculoskeletal:        General: Normal range of motion.     Cervical back: Normal range of motion and neck supple.  Neurological: She is  alert and oriented to person, place, and time. Gait normal.  Skin: Skin is warm and dry.  Psychiatric: Affect normal.  Nursing note and vitals reviewed.    NST: FHR baseline 130 bpm, Variability: moderate, Accelerations:present, Decelerations:  Absent= Cat 1/Reactive UC:   irregular, every 4-7 minutes, lasting120-160 seconds SVE:   Dilation: 5 Effacement (%): 90 Station: -2 Exam by:: benji Forensic scientist, vertex verified by fetal sutures.  Leopold's: Position vertex, EFW 7.5lbs via leopold's.   Labs: No results found for this or any previous visit (from the past 24 hour(s)).  Imaging:  No results found.  MAU  Course: Orders Placed This Encounter  Procedures  . Cervical Exam   No orders of the defined types were placed in this encounter.   Assessment/Plan: Linda Best is a 34 y.o. female, G1P0000, IUP at 39.1 weeks [facility] pt of Dr , presenting for normal spontaneous latent labor, Intact, GBS-. Unremarkable PNC.Marland Kitchen Pt endorse + Fm. Denies vaginal leakage. Denies vaginal bleeding.   FWB: Cat 1 Fetal Tracing.   Plan: Admit to Birthing Suite per consult with Dr Sallye Ober Desires admission with all natural delivery.  Routine [facility] orders Pain med/epidural prn Anticipate labor progression   Dale [place] NP-C, CNM, MSN 02/25/2020, 11:17 PM

## 2020-02-25 NOTE — MAU Note (Deleted)
PT LEFT WITHOUT BEING SEEN - TRIED TO TALK HER INTO STAYING  BUT SAYS" SHE DIDN'T WANT TO " STRIP DOWN " . I EXPLAINED  WE NEED TO PUT HER MONITOR AND CHECK CERVIX- SHE SAID NO. Marland Kitchen  SAYS WOULD COME BACK IF NEEDED.

## 2020-02-26 ENCOUNTER — Encounter (HOSPITAL_COMMUNITY): Payer: Self-pay | Admitting: Obstetrics & Gynecology

## 2020-02-26 ENCOUNTER — Inpatient Hospital Stay (HOSPITAL_COMMUNITY): Payer: Managed Care, Other (non HMO) | Admitting: Anesthesiology

## 2020-02-26 LAB — RESPIRATORY PANEL BY RT PCR (FLU A&B, COVID)
Influenza A by PCR: NEGATIVE
Influenza B by PCR: NEGATIVE
SARS Coronavirus 2 by RT PCR: NEGATIVE

## 2020-02-26 LAB — TYPE AND SCREEN
ABO/RH(D): A POS
Antibody Screen: NEGATIVE

## 2020-02-26 LAB — ABO/RH: ABO/RH(D): A POS

## 2020-02-26 LAB — RPR: RPR Ser Ql: NONREACTIVE

## 2020-02-26 MED ORDER — BUTORPHANOL TARTRATE 1 MG/ML IJ SOLN
2.0000 mg | Freq: Once | INTRAMUSCULAR | Status: AC
  Administered 2020-02-26: 2 mg via INTRAVENOUS
  Filled 2020-02-26: qty 2

## 2020-02-26 MED ORDER — OXYTOCIN 40 UNITS IN NORMAL SALINE INFUSION - SIMPLE MED
1.0000 m[IU]/min | INTRAVENOUS | Status: DC
  Administered 2020-02-26: 20:00:00 2 m[IU]/min via INTRAVENOUS
  Filled 2020-02-26: qty 1000

## 2020-02-26 MED ORDER — EPHEDRINE 5 MG/ML INJ
10.0000 mg | INTRAVENOUS | Status: DC | PRN
  Administered 2020-02-26: 10 mg via INTRAVENOUS

## 2020-02-26 MED ORDER — PHENYLEPHRINE 40 MCG/ML (10ML) SYRINGE FOR IV PUSH (FOR BLOOD PRESSURE SUPPORT): 80.0000 ug | PREFILLED_SYRINGE | INTRAVENOUS | Status: DC | PRN

## 2020-02-26 MED ORDER — PHENYLEPHRINE 40 MCG/ML (10ML) SYRINGE FOR IV PUSH (FOR BLOOD PRESSURE SUPPORT)
80.0000 ug | PREFILLED_SYRINGE | INTRAVENOUS | Status: AC | PRN
  Administered 2020-02-26 (×3): 80 ug via INTRAVENOUS

## 2020-02-26 MED ORDER — OXYTOCIN 40 UNITS IN NORMAL SALINE INFUSION - SIMPLE MED
1.0000 m[IU]/min | INTRAVENOUS | Status: DC
  Administered 2020-02-26: 1 m[IU]/min via INTRAVENOUS

## 2020-02-26 MED ORDER — DIPHENHYDRAMINE HCL 50 MG/ML IJ SOLN: 12.5000 mg | INTRAMUSCULAR | Status: DC | PRN

## 2020-02-26 MED ORDER — EPHEDRINE 5 MG/ML INJ
10.0000 mg | INTRAVENOUS | Status: DC | PRN
  Filled 2020-02-26 (×2): qty 10

## 2020-02-26 MED ORDER — LACTATED RINGERS AMNIOINFUSION: INTRAVENOUS | Status: DC

## 2020-02-26 MED ORDER — LACTATED RINGERS IV SOLN: 500.0000 mL | Freq: Once | INTRAVENOUS | Status: DC

## 2020-02-26 MED ORDER — FENTANYL-BUPIVACAINE-NACL 0.5-0.125-0.9 MG/250ML-% EP SOLN
EPIDURAL | Status: AC
  Filled 2020-02-26: qty 250

## 2020-02-26 MED ORDER — LIDOCAINE HCL (PF) 1 % IJ SOLN
INTRAMUSCULAR | Status: DC | PRN
  Administered 2020-02-26: 10 mL via EPIDURAL

## 2020-02-26 MED ORDER — SODIUM CHLORIDE (PF) 0.9 % IJ SOLN
INTRAMUSCULAR | Status: DC | PRN
  Administered 2020-02-26: 12 mL/h via EPIDURAL

## 2020-02-26 MED ORDER — PHENYLEPHRINE 40 MCG/ML (10ML) SYRINGE FOR IV PUSH (FOR BLOOD PRESSURE SUPPORT)
PREFILLED_SYRINGE | INTRAVENOUS | Status: AC
  Filled 2020-02-26: qty 10

## 2020-02-26 MED ORDER — TERBUTALINE SULFATE 1 MG/ML IJ SOLN: 0.2500 mg | Freq: Once | INTRAMUSCULAR | Status: DC | PRN

## 2020-02-26 MED ORDER — PROMETHAZINE HCL 25 MG/ML IJ SOLN
12.5000 mg | Freq: Once | INTRAMUSCULAR | Status: AC
  Administered 2020-02-26: 12.5 mg via INTRAVENOUS
  Filled 2020-02-26: qty 1

## 2020-02-26 MED ORDER — FENTANYL-BUPIVACAINE-NACL 0.5-0.125-0.9 MG/250ML-% EP SOLN
12.0000 mL/h | EPIDURAL | Status: DC | PRN
  Filled 2020-02-26: qty 250

## 2020-02-26 NOTE — Progress Notes (Signed)
OB PN:  S: Pt more comfortable with epidural, no acute complaints  O: BP (!) 90/40   Pulse 92   Temp 98.7 F (37.1 C) (Axillary)   Resp 18   Ht 5\' 7"  (1.702 m)   Wt 72.8 kg   SpO2 95%   BMI 25.12 kg/m   FHT: 155bpm, moderate variablity, + accels, occasional variable decels Toco: irregular SVE: 6.5/100/-1, AROM- questionable light mec, IUPC placed  A/P: 34 y.o. G1P0 @ [redacted]w[redacted]d presented in active labor 1. FWB: Cat. II- overall reassuring, pt repositioned 2. Labor: continue Pit per protocol Pain: continue epidural GBS: negative  [redacted]w[redacted]d, DO [phone] (cell) [phone] (office)

## 2020-02-26 NOTE — Progress Notes (Signed)
[facility], CNM informed that prenatal records not on file. Requested this information. Provider reports that she does not have access to the records and they can be obtained in the am.

## 2020-02-26 NOTE — Anesthesia Preprocedure Evaluation (Signed)

## 2020-02-26 NOTE — Anesthesia Procedure Notes (Signed)
Epidural Patient location during procedure: OB Start time: 02/26/2020 8:52 AM End time: 02/26/2020 9:00 AM  Staffing Anesthesiologist: Lucretia Kern, MD Performed: anesthesiologist   Preanesthetic Checklist Completed: patient identified, IV checked, risks and benefits discussed, monitors and equipment checked, pre-op evaluation and timeout performed  Epidural Patient position: sitting Prep: DuraPrep Patient monitoring: heart rate, continuous pulse ox and blood pressure Approach: midline Location: L3-L4 Injection technique: LOR air  Needle:  Needle type: Tuohy  Needle gauge: 17 G Needle length: 9 cm Needle insertion depth: 6 cm Catheter type: closed end flexible Catheter size: 19 Gauge Catheter at skin depth: 11 cm Test dose: negative  Assessment Events: blood not aspirated, injection not painful, no injection resistance, no paresthesia and negative IV test  Additional Notes Reason for block:procedure for pain

## 2020-02-26 NOTE — Progress Notes (Signed)
OB PN:  S: Pt now trying to rest s/p Stadol  O: BP (!) 91/49   Pulse (!) 102   Temp 98.7 F (37.1 C) (Oral)   Resp 16   Ht 5\' 7"  (1.702 m)   Wt 72.8 kg   BMI 25.12 kg/m   FHT: 150bpm, moderate variablity, + accels, occasional variable decels Toco: irregular SVE: deferred- last exam 6/100/0  A/P: 34 y.o. G1P0 @ [redacted]w[redacted]d presented in active labor 1. FWB: Cat. II- overall reassuring, plan to reposition and monitor closely this am 2. Labor: pt previously has declined any intervention.  Reviewed with patient pros/cons of augmentation.  She is agreeable to start Pitocin this am Pain: IV Stadol x1, epidural, nitrous, or other IV medication upon request GBS: negative  [redacted]w[redacted]d, DO [phone] (cell) [phone] (office)

## 2020-02-26 NOTE — Progress Notes (Signed)
I met with patient briefly to introduce myself as part of the anesthesia team here at [facility]. I briefly interviewed the patient about pertinent medical history, anesthesia history, allergies and performed an airway exam . Pt is a Mallampati 2 and has no personal or family history of MH. Patient made aware that anesthesia team is available when/if needed 24/7.

## 2020-02-26 NOTE — Progress Notes (Addendum)
OB PN:  S: Pt more comfortable with epidural, no acute complaints  O: BP (!) 99/49   Pulse 84   Temp 98.5 F (36.9 C) (Oral)   Resp 18   Ht 5\' 7"  (1.702 m)   Wt 72.8 kg   SpO2 95%   BMI 25.12 kg/m   FHT: 155bpm, moderate variablity, + accels, occasional variable decels Toco: q4-49min, inadequate MVUs SVE: 6.5/100/0- exam c/w better engagement and effacement, IUPC adjusted  A/P: 34 y.o. G1P0 @ [redacted]w[redacted]d presented in active labor 1. FWB: Cat. II- overall reassuring, pt repositioned 2. Labor: continue Pit per protocol, IUPC in place for further titration of Pitocin Pain: continue epidural GBS: negative  [redacted]w[redacted]d, DO [phone] (cell) [phone] (office)

## 2020-02-26 NOTE — Progress Notes (Signed)
Labor Progress Note  Linda Best is a 34 y.o. female, G1P0000, IUP at 39.1 weeks [facility] pt of Dr , presenting for normal spontaneous latent labor, Intact, GBS-. Unremarkable PNC.Marland Kitchen Pt endorse + Fm. Denies vaginal leakage. Denies vaginal bleeding.   Subjective: Pt resting in bed, RN called per pt request to discuss augmentation. I discussed R/B/A of Pitocin vs AROM, I also discussed pain management options, such as IV pain meds, Nitrous, and epidural. Pt requested sleep, pt desired restorative rest for now then to wake and decide which Augmentin she would like.  Patient Active Problem List   Diagnosis Date Noted  . Normal labor and delivery 02/25/2020  . Shortness of breath 05/03/2018  . Paroxysmal tachycardia (HCC) 03/12/2018   Objective: BP (!) 94/44   Pulse 81   Temp 98.7 F (37.1 C) (Oral)   Resp 16   Ht 5\' 7"  (1.702 m)   Wt 72.8 kg   BMI 25.12 kg/m  No intake/output data recorded. No intake/output data recorded. NST: FHR baseline 145 bpm, Variability: moderate, Accelerations:present, Decelerations:  Absent= Cat 1/Reactive CTX:  irregular, every 2-10 minutes, lasting 60-120 seconds Uterus gravid, soft non tender, moderate to palpate with contractions.  SVE:  Dilation: 6 Effacement (%): 100 Station: 0 Exam by:: [email or web address], rn  Assessment:  Linda Best is a 34 y.o. female, G1P0000, IUP at 39.1 weeks [facility] pt of Dr , presenting for normal spontaneous latent labor, Intact, GBS-. Unremarkable PNC.32 Pt endorse + Fm. Denies vaginal leakage. Denies vaginal bleeding. Progressing in latent labor, stalled out right before acitve labor.  Patient Active Problem List   Diagnosis Date Noted  . Normal labor and delivery 02/25/2020  . Shortness of breath 05/03/2018  . Paroxysmal tachycardia (HCC) 03/12/2018   NICHD: Category 1  Membranes:  Intact, no s/s of infection  Pain management:               IV pain management: x0  Nitrous: No in use             Epidural  placement: Not placed  GBS Positive   Plan: Continue labor plan Continuous monitoring Rest Ambulate Stadol 2mg /Phenergan 12.5mg  to be given for restorative rest.  Frequent position changes to facilitate fetal rotation and descent. Dr 03/14/2018 to reassess after pt sleeps.  Continue pitocin per protocol Anticipate labor progression and vaginal delivery.   , NP-C, CNM, MSN 02/26/2020. 6:10 AM

## 2020-02-27 ENCOUNTER — Encounter (HOSPITAL_COMMUNITY): Payer: Self-pay | Admitting: Obstetrics & Gynecology

## 2020-02-27 MED ORDER — SIMETHICONE 80 MG PO CHEW: 80.0000 mg | CHEWABLE_TABLET | ORAL | Status: DC | PRN

## 2020-02-27 MED ORDER — IBUPROFEN 600 MG PO TABS
600.0000 mg | ORAL_TABLET | Freq: Four times a day (QID) | ORAL | Status: DC
  Administered 2020-02-27 – 2020-02-29 (×8): 600 mg via ORAL
  Filled 2020-02-27 (×8): qty 1

## 2020-02-27 MED ORDER — TETANUS-DIPHTH-ACELL PERTUSSIS 5-2.5-18.5 LF-MCG/0.5 IM SUSP: 0.5000 mL | Freq: Once | INTRAMUSCULAR | Status: DC

## 2020-02-27 MED ORDER — BENZOCAINE-MENTHOL 20-0.5 % EX AERO
1.0000 "application " | INHALATION_SPRAY | CUTANEOUS | Status: DC | PRN
  Administered 2020-02-27 – 2020-02-28 (×2): 1 via TOPICAL
  Filled 2020-02-27 (×2): qty 56

## 2020-02-27 MED ORDER — ONDANSETRON HCL 4 MG PO TABS: 4.0000 mg | ORAL_TABLET | ORAL | Status: DC | PRN

## 2020-02-27 MED ORDER — WITCH HAZEL-GLYCERIN EX PADS: 1.0000 "application " | MEDICATED_PAD | CUTANEOUS | Status: DC | PRN

## 2020-02-27 MED ORDER — DIPHENHYDRAMINE HCL 25 MG PO CAPS: 25.0000 mg | ORAL_CAPSULE | Freq: Four times a day (QID) | ORAL | Status: DC | PRN

## 2020-02-27 MED ORDER — ONDANSETRON HCL 4 MG/2ML IJ SOLN: 4.0000 mg | INTRAMUSCULAR | Status: DC | PRN

## 2020-02-27 MED ORDER — COCONUT OIL OIL
1.0000 "application " | TOPICAL_OIL | Status: DC | PRN
  Administered 2020-02-28: 1 via TOPICAL

## 2020-02-27 MED ORDER — SENNOSIDES-DOCUSATE SODIUM 8.6-50 MG PO TABS
2.0000 | ORAL_TABLET | ORAL | Status: DC
  Administered 2020-02-28 – 2020-02-29 (×2): 2 via ORAL
  Filled 2020-02-27 (×2): qty 2

## 2020-02-27 MED ORDER — PRENATAL MULTIVITAMIN CH
1.0000 | ORAL_TABLET | Freq: Every day | ORAL | Status: DC
  Administered 2020-02-27 – 2020-02-29 (×3): 1 via ORAL
  Filled 2020-02-27 (×3): qty 1

## 2020-02-27 MED ORDER — ACETAMINOPHEN 325 MG PO TABS
650.0000 mg | ORAL_TABLET | ORAL | Status: DC | PRN
  Administered 2020-02-27 – 2020-02-29 (×3): 650 mg via ORAL
  Filled 2020-02-27 (×3): qty 2

## 2020-02-27 MED ORDER — DIBUCAINE (PERIANAL) 1 % EX OINT: 1.0000 "application " | TOPICAL_OINTMENT | CUTANEOUS | Status: DC | PRN

## 2020-02-27 MED ORDER — ZOLPIDEM TARTRATE 5 MG PO TABS: 5.0000 mg | ORAL_TABLET | Freq: Every evening | ORAL | Status: DC | PRN

## 2020-02-27 NOTE — Progress Notes (Signed)
OB PN:  S: Pt resting comfortably with epidural  O: BP 113/60   Pulse 79   Temp 99 F (37.2 C) (Axillary)   Resp 16   Ht 5\' 7"  (1.702 m)   Wt 72.8 kg   SpO2 95%   BMI 25.12 kg/m   FHT: 150bpm, moderate variablity, + accels, occasional variable decels Toco: q2-67min, MVUs inadequate but improved 60-110 SVE: 10/100/0  A/P: 34 y.o. G1P0 @ [redacted]w[redacted]d presented in active labor 1. FWB: Cat. II- amnioninfusion running, pt continues to be repositioned 2. Labor: Pit currently per protocol Pain: continue epidural GBS: negative  [redacted]w[redacted]d, DO [phone] (cell) [phone] (office)

## 2020-02-27 NOTE — Anesthesia Postprocedure Evaluation (Signed)
Anesthesia Post Note  Patient: Linda Best  Procedure(s) Performed: AN AD HOC LABOR EPIDURAL     Patient location during evaluation: Mother Baby Anesthesia Type: Epidural Level of consciousness: awake Pain management: satisfactory to patient Vital Signs Assessment: post-procedure vital signs reviewed and stable Respiratory status: spontaneous breathing Cardiovascular status: stable Anesthetic complications: no    Last Vitals:  Vitals:   02/27/20 1000 02/27/20 1410  BP: 107/67 95/60  Pulse: 86 (!) 101  Resp: 18 16  Temp: 37.1 C 37.1 C  SpO2: 100% 98%    Last Pain:  Vitals:   02/27/20 1410  TempSrc: Oral  PainSc: 3    Pain Goal: Patients Stated Pain Goal: 2 (02/27/20 1410)                 Cephus Shelling

## 2020-02-27 NOTE — Lactation Note (Signed)
This note was copied from a baby's chart. Lactation Consultation Note  Patient Name: Linda Best [id number] Date: 02/27/2020 Reason for consult: Initial assessment;1st time breastfeeding;Term;Primapara  [id number] Initial visit with P1 mom who delivered @ 39.3wks, baby is now 7 hrs old, vacuum assisted delivery.  Per mom, baby has breastfed well a few times, as much as an hour at a time. Mom states baby just fed for one hour and 15 minutes. FOB at bedside and engaged in interaction. Infant awake and alert, smacking lips. LC offered to assist with latch and mom agrees.  Mom with excellent technique with trying to latch baby to left breast in cross-cradle position. Only critique is turn baby in to mom so that he is belly to belly with mom instead of trying to feed over his shoulder. Mom waiting for infant to open wide and guided on to breast with nose touching breast. However mom with tendency to hold breast in compressed position and not letting go. Discouraged mom from developing this habit and to only support her breast when necessary. Repeated attempts to latch but infant seemingly sucking on tongue and possibly saliva, no swallows audible. Infant drowsy and aggressive at the breast. Encouraged mom to try to burp infant after feedings and hold STS until infant falls asleep.  Reviewed normal infant behavior in first 24 hours; feed 8-12 times in 24 hours and with feeding cues. Feeding cues reviewed.  Mom has Medela DEBP at home.  Mom with questions about nipple cream and cleaning her breasts after feedings. Discouraged mom from washing her breasts unless bathing and only apply products to breasts if soreness or cracking develops; strongly encouraged to seek advice from [facility] before application.  Lactation brochure with phone numbers left at bedside. Reviewed inpatient and outpatient services and support group information on [email or web address]  Maternal Data Has patient been taught Hand  Expression?: Yes Does the patient have breastfeeding experience prior to this delivery?: No  Feeding Feeding Type: Breast Fed  LATCH Score Latch: Repeated attempts needed to sustain latch, nipple held in mouth throughout feeding, stimulation needed to elicit sucking reflex.  Audible Swallowing: None  Type of Nipple: Everted at rest and after stimulation  Comfort (Breast/Nipple): Soft / non-tender  Hold (Positioning): No assistance needed to correctly position infant at breast.  LATCH Score: 7  Interventions Interventions: Breast feeding basics reviewed;Assisted with latch;Skin to skin;Breast massage;Hand express;Position options;Support pillows;Adjust position  Lactation Tools Discussed/Used WIC Program: No   Consult Status Consult Status: Follow-up Date: 02/28/20 Follow-up type: In-patient    Virgia Land 02/27/2020, 1:57 PM

## 2020-02-28 LAB — CBC
HCT: 32.2 % — ABNORMAL LOW (ref 36.0–46.0)
Hemoglobin: 10.4 g/dL — ABNORMAL LOW (ref 12.0–15.0)
MCH: 29.6 pg (ref 26.0–34.0)
MCHC: 32.3 g/dL (ref 30.0–36.0)
MCV: 91.7 fL (ref 80.0–100.0)
Platelets: 240 10*3/uL (ref 150–400)
RBC: 3.51 MIL/uL — ABNORMAL LOW (ref 3.87–5.11)
RDW: 13.6 % (ref 11.5–15.5)
WBC: 10.6 10*3/uL — ABNORMAL HIGH (ref 4.0–10.5)
nRBC: 0 % (ref 0.0–0.2)

## 2020-02-28 NOTE — Lactation Note (Signed)
This note was copied from a baby's chart. Lactation Consultation Note  Patient Name: Linda Best [id number] Date: 02/28/2020 Reason for consult: Follow-up assessment;Term;Primapara;1st time breastfeeding  P1 mother whose infant is now 75 hours old.  This is a term baby.  Baby was swaddled and asleep when I arrived.  Questioned the length of time baby has been feeding at the breast.  At times, he has stayed latched for an hour and 15 minutes.  Discussed nutritive vs non nutritive sucking.  Explained the importance of making sure baby is awake and actively feeding at the breast.  Examples of how to gently stimulate him to keep him awake provided.  Encouraged hand expression and breast massage.  Mother's breasts are soft and non tender and nipples are everted and intact.  Mother stated her nipples have been sore.  Discussed effective latching and the amount of time spent at the breast.  Suggested using EBM to rub into nipple/areola after feeding for comfort.  Coconut oil given for added comfort.  Mother appreciative.  Encouraged to continue feeding 8-12 times/24 hours or sooner if baby shows cues.  Mother familiar with feeding cues and hand expression.  She will call for latch assistance as needed.  Baby has not had a BM yet since delivery; continue to observe for first BM.    Mother has a DEBP for home use.  Father present.   Maternal Data    Feeding Feeding Type: Breast Fed  LATCH Score Latch: Grasps breast easily, tongue down, lips flanged, rhythmical sucking.  Audible Swallowing: A few with stimulation  Type of Nipple: Everted at rest and after stimulation  Comfort (Breast/Nipple): Soft / non-tender  Hold (Positioning): No assistance needed to correctly position infant at breast.  LATCH Score: 9  Interventions    Lactation Tools Discussed/Used     Consult Status Consult Status: Follow-up Date: 02/29/20 Follow-up type: In-patient    Ontario Pettengill R Arielle Eber 02/28/2020, 11:08  AM

## 2020-02-29 MED ORDER — IBUPROFEN 600 MG PO TABS: 600.0000 mg | ORAL_TABLET | Freq: Four times a day (QID) | ORAL | 0 refills | Status: AC

## 2020-02-29 NOTE — Discharge Summary (Signed)
OB Discharge Summary     Patient Name: Linda Best DOB: [birth year] MRN: [id number]  Date of admission: 02/25/2020 Delivering MD: Janyth Pupa   Date of discharge: 02/29/2020  Admitting diagnosis: Normal labor and delivery [O80] Intrauterine pregnancy: [redacted]w[redacted]d     Secondary diagnosis:  Active Problems:   Normal labor and delivery  Additional problems: None     Discharge diagnosis: Term Pregnancy Delivered                                                                                                Post partum procedures:None  Augmentation: AROM and Pitocin  Complications: None  Hospital course:  Onset of Labor With Vaginal Delivery     34 y.o. yo G1P1001 at [redacted]w[redacted]d was admitted in Latent Labor on 02/25/2020. Patient had an uncomplicated labor course as follows:  Membrane Rupture Time/Date: 1:05 PM ,02/26/2020   Intrapartum Procedures: Episiotomy: None [1]                                         Lacerations:  2nd degree [3];Perineal [11]  Patient had a delivery of a Viable infant. 02/27/2020  Information for the patient's newborn:  Skyelar, Halliday [[id number]]  Delivery Method: Vaginal, Vacuum (Extractor)(Filed from Delivery Summary)     Pateint had an uncomplicated postpartum course.  She is ambulating, tolerating a regular diet, passing flatus, and urinating well. Patient is discharged home in stable condition on 02/29/20.   Physical exam  Vitals:   02/28/20 0500 02/28/20 1400 02/28/20 2202 02/29/20 0515  BP: 106/66 111/72 106/65   Pulse: 86 86 96   Resp: 18 16 16 18   Temp: 98.7 F (37.1 C) 98.4 F (36.9 C) 98.8 F (37.1 C)   TempSrc: Oral Oral Oral   SpO2: 99%  99%   Weight:      Height:       General: alert, cooperative and no distress Lochia: appropriate Uterine Fundus: firm Incision: N/A DVT Evaluation: No evidence of DVT seen on physical exam. Calf/Ankle edema is present Labs: Lab Results  Component Value Date   WBC 10.6 (H) 02/28/2020   HGB 10.4  (L) 02/28/2020   HCT 32.2 (L) 02/28/2020   MCV 91.7 02/28/2020   PLT 240 02/28/2020   CMP Latest Ref Rng & Units 03/04/2018  Glucose 65 - 99 mg/dL 124(H)  BUN 6 - 20 mg/dL 14  Creatinine 0.44 - 1.00 mg/dL 0.82  Sodium 135 - 145 mmol/L 137  Potassium 3.5 - 5.1 mmol/L 3.7  Chloride 101 - 111 mmol/L 105  CO2 22 - 32 mmol/L 20(L)  Calcium 8.9 - 10.3 mg/dL 9.4    Discharge instruction: per After Visit Summary and "Baby and Me Booklet".  After visit meds:  Allergies as of 02/29/2020   No Known Allergies     Medication List    TAKE these medications   acetaminophen 500 MG tablet Commonly known as: TYLENOL Take 500 mg by mouth every 6 (six) hours as needed for mild  pain or headache.   ibuprofen 600 MG tablet Commonly known as: ADVIL Take 1 tablet (600 mg total) by mouth every 6 (six) hours.   prenatal multivitamin Tabs tablet Take 1 tablet by mouth daily at 12 noon.       Diet: routine diet  Activity: Advance as tolerated. Pelvic rest for 6 weeks.   Outpatient follow up:2 weeks Follow up Appt:No future appointments. Follow up Visit:No follow-ups on file.  Postpartum contraception: Undecided  Newborn Data: Live born female  Birth Weight: 6 lb 4.9 oz (2860 g) APGAR: 8, 9  Newborn Delivery   Birth date/time: 02/27/2020 06:45:00 Delivery type: Vaginal, Vacuum (Extractor)      Baby Feeding: Breast Disposition:Pending  S/p Circumcision on 02/28/20.   02/29/2020 Geryl Rankins, MD

## 2020-02-29 NOTE — Lactation Note (Signed)
This note was copied from a baby's chart. Lactation Consultation Note  Patient Name: Linda Best [id number] Date: 02/29/2020 Reason for consult: Follow-up assessment;Primapara Baby is 50 hours old/4% weight loss.  Baby had meconium stained fluid at delivery but no stool since.  Baby has had 4 voids.  Mom feels feedings are going well.  She hand expresses colostrum and finger feeds baby.  Instructed to feed often and continue hand expressing.  Discussed milk coming to volume and the prevention and treatment of engorgement.  Encouraged to call for assist prn.  Maternal Data    Feeding    LATCH Score                   Interventions    Lactation Tools Discussed/Used     Consult Status Consult Status: Follow-up Date: 03/01/20 Follow-up type: In-patient    [name] 02/29/2020, 9:02 AM

## 2020-02-29 NOTE — Discharge Instructions (Signed)
Postpartum Care After Vaginal Delivery This sheet gives you information about how to care for yourself from the time you deliver your baby to up to 6-12 weeks after delivery (postpartum period). Your health care provider may also give you more specific instructions. If you have problems or questions, contact your health care provider. Follow these instructions at home: Vaginal bleeding  It is normal to have vaginal bleeding (lochia) after delivery. Wear a sanitary pad for vaginal bleeding and discharge. ? During the first week after delivery, the amount and appearance of lochia is often similar to a menstrual period. ? Over the next few weeks, it will gradually decrease to a dry, yellow-brown discharge. ? For most women, lochia stops completely by 4-6 weeks after delivery. Vaginal bleeding can vary from woman to woman.  Change your sanitary pads frequently. Watch for any changes in your flow, such as: ? A sudden increase in volume. ? A change in color. ? Large blood clots.  If you pass a blood clot from your vagina, save it and call your health care provider to discuss. Do not flush blood clots down the toilet before talking with your health care provider.  Do not use tampons or douches until your health care provider says this is safe.  If you are not breastfeeding, your period should return 6-8 weeks after delivery. If you are feeding your child breast milk only (exclusive breastfeeding), your period may not return until you stop breastfeeding. Perineal care  Keep the area between the vagina and the anus (perineum) clean and dry as told by your health care provider. Use medicated pads and pain-relieving sprays and creams as directed.  If you had a cut in the perineum (episiotomy) or a tear in the vagina, check the area for signs of infection until you are healed. Check for: ? More redness, swelling, or pain. ? Fluid or blood coming from the cut or tear. ? Warmth. ? Pus or a bad  smell.  You may be given a squirt bottle to use instead of wiping to clean the perineum area after you go to the bathroom. As you start healing, you may use the squirt bottle before wiping yourself. Make sure to wipe gently.  To relieve pain caused by an episiotomy, a tear in the vagina, or swollen veins in the anus (hemorrhoids), try taking a warm sitz bath 2-3 times a day. A sitz bath is a warm water bath that is taken while you are sitting down. The water should only come up to your hips and should cover your buttocks. Breast care  Within the first few days after delivery, your breasts may feel heavy, full, and uncomfortable (breast engorgement). Milk may also leak from your breasts. Your health care provider can suggest ways to help relieve the discomfort. Breast engorgement should go away within a few days.  If you are breastfeeding: ? Wear a bra that supports your breasts and fits you well. ? Keep your nipples clean and dry. Apply creams and ointments as told by your health care provider. ? You may need to use breast pads to absorb milk that leaks from your breasts. ? You may have uterine contractions every time you breastfeed for up to several weeks after delivery. Uterine contractions help your uterus return to its normal size. ? If you have any problems with breastfeeding, work with your health care provider or lactation consultant.  If you are not breastfeeding: ? Avoid touching your breasts a lot. Doing this can make   your breasts produce more milk. ? Wear a good-fitting bra and use cold packs to help with swelling. ? Do not squeeze out (express) milk. This causes you to make more milk. Intimacy and sexuality  Ask your health care provider when you can engage in sexual activity. This may depend on: ? Your risk of infection. ? How fast you are healing. ? Your comfort and desire to engage in sexual activity.  You are able to get pregnant after delivery, even if you have not had  your period. If desired, talk with your health care provider about methods of birth control (contraception). Medicines  Take over-the-counter and prescription medicines only as told by your health care provider.  If you were prescribed an antibiotic medicine, take it as told by your health care provider. Do not stop taking the antibiotic even if you start to feel better. Activity  Gradually return to your normal activities as told by your health care provider. Ask your health care provider what activities are safe for you.  Rest as much as possible. Try to rest or take a nap while your baby is sleeping. Eating and drinking   Drink enough fluid to keep your urine pale yellow.  Eat high-fiber foods every day. These may help prevent or relieve constipation. High-fiber foods include: ? Whole grain cereals and breads. ? Brown rice. ? Beans. ? Fresh fruits and vegetables.  Do not try to lose weight quickly by cutting back on calories.  Take your prenatal vitamins until your postpartum checkup or until your health care provider tells you it is okay to stop. Lifestyle  Do not use any products that contain nicotine or tobacco, such as cigarettes and e-cigarettes. If you need help quitting, ask your health care provider.  Do not drink alcohol, especially if you are breastfeeding. General instructions  Keep all follow-up visits for you and your baby as told by your health care provider. Most women visit their health care provider for a postpartum checkup within the first 3-6 weeks after delivery. Contact a health care provider if:  You feel unable to cope with the changes that your child brings to your life, and these feelings do not go away.  You feel unusually sad or worried.  Your breasts become red, painful, or hard.  You have a fever.  You have trouble holding urine or keeping urine from leaking.  You have little or no interest in activities you used to enjoy.  You have not  breastfed at all and you have not had a menstrual period for 12 weeks after delivery.  You have stopped breastfeeding and you have not had a menstrual period for 12 weeks after you stopped breastfeeding.  You have questions about caring for yourself or your baby.  You pass a blood clot from your vagina. Get help right away if:  You have chest pain.  You have difficulty breathing.  You have sudden, severe leg pain.  You have severe pain or cramping in your lower abdomen.  You bleed from your vagina so much that you fill more than one sanitary pad in one hour. Bleeding should not be heavier than your heaviest period.  You develop a severe headache.  You faint.  You have blurred vision or spots in your vision.  You have bad-smelling vaginal discharge.  You have thoughts about hurting yourself or your baby. If you ever feel like you may hurt yourself or others, or have thoughts about taking your own life, get help   right away. You can go to the nearest emergency department or call:  Your local emergency services (911 in the [place]  A suicide crisis helpline, such as the National Suicide Prevention Lifeline at 1-800-273-8255. This is open 24 hours a day. Summary  The period of time right after you deliver your newborn up to 6-12 weeks after delivery is called the postpartum period.  Gradually return to your normal activities as told by your health care provider.  Keep all follow-up visits for you and your baby as told by your health care provider. This information is not intended to replace advice given to you by your health care provider. Make sure you discuss any questions you have with your health care provider. Document Revised: 12/15/2017 Document Reviewed: 09/25/2017 Elsevier Patient Education  2020 [employer]  Postpartum Baby Blues The postpartum period begins right after the birth of a baby. During this time, there is often a lot of joy and excitement. It is also a  time of many changes in the life of the parents. No matter how many times a mother gives birth, each child brings new challenges to the family, including different ways of relating to one another. It is common to have feelings of excitement along with confusing changes in moods, emotions, and thoughts. You may feel happy one minute and sad or stressed the next. These feelings of sadness usually happen in the period right after you have your baby, and they go away within a week or two. This is called the "baby blues." What are the causes? There is no known cause of baby blues. It is likely caused by a combination of factors. However, changes in hormone levels after childbirth are believed to trigger some of the symptoms. Other factors that can play a role in these mood changes include:  Lack of sleep.  Stressful life events, such as poverty, caring for a loved one, or death of a loved one.  Genetics. What are the signs or symptoms? Symptoms of this condition include:  Brief changes in mood, such as going from extreme happiness to sadness.  Decreased concentration.  Difficulty sleeping.  Crying spells and tearfulness.  Loss of appetite.  Irritability.  Anxiety. If the symptoms of baby blues last for more than 2 weeks or become more severe, you may have postpartum depression. How is this diagnosed? This condition is diagnosed based on an evaluation of your symptoms. There are no medical or lab tests that lead to a diagnosis, but there are various questionnaires that a health care provider may use to identify women with the baby blues or postpartum depression. How is this treated? Treatment is not needed for this condition. The baby blues usually go away on their own in 1-2 weeks. Social support is often all that is needed. You will be encouraged to get adequate sleep and rest. Follow these instructions at home: Lifestyle      Get as much rest as you can. Take a nap when the baby  sleeps.  Exercise regularly as told by your health care provider. Some women find yoga and walking to be helpful.  Eat a balanced and nourishing diet. This includes plenty of fruits and vegetables, whole grains, and lean proteins.  Do little things that you enjoy. Have a cup of tea, take a bubble bath, read your favorite magazine, or listen to your favorite music.  Avoid alcohol.  Ask for help with household chores, cooking, grocery shopping, or running errands. Do not try to   do everything yourself. Consider hiring a postpartum doula to help. This is a professional who specializes in providing support to new mothers.  Try not to make any major life changes during pregnancy or right after giving birth. This can add stress. General instructions  Talk to people close to you about how you are feeling. Get support from your partner, family members, friends, or other new moms. You may want to join a support group.  Find ways to cope with stress. This may include: ? Writing your thoughts and feelings in a journal. ? Spending time outside. ? Spending time with people who make you laugh.  Try to stay positive in how you think. Think about the things you are grateful for.  Take over-the-counter and prescription medicines only as told by your health care provider.  Let your health care provider know if you have any concerns.  Keep all postpartum visits as told by your health care provider. This is important. Contact a health care provider if:  Your baby blues do not go away after 2 weeks. Get help right away if:  You have thoughts of taking your own life (suicidal thoughts).  You think you may harm the baby or other people.  You see or hear things that are not there (hallucinations). Summary  After giving birth, you may feel happy one minute and sad or stressed the next. Feelings of sadness that happen right after the baby is born and go away after a week or two are called the "baby  blues."  You can manage the baby blues by getting enough rest, eating a healthy diet, exercising, spending time with supportive people, and finding ways to cope with stress.  If feelings of sadness and stress last longer than 2 weeks or get in the way of caring for your baby, talk to your health care provider. This may mean you have postpartum depression. This information is not intended to replace advice given to you by your health care provider. Make sure you discuss any questions you have with your health care provider. Document Revised: 04/05/2019 Document Reviewed: 02/07/2017 Elsevier Patient Education  2020 Elsevier Inc.  

## 2020-02-29 NOTE — Progress Notes (Signed)
Postpartum Note Day # 1  S:  Patient resting comfortable in bed.  Pain controlled.  Tolerating general diet. + flatus, + BM.  Lochia moderate.  Ambulating without difficulty.  She denies n/v/f/c, SOB, or CP.  Pt plans on breastfeeding.  O: Temp:  [98.4 F (36.9 C)-98.8 F (37.1 C)] 98.8 F (37.1 C) (03/05 2202) Pulse Rate:  [86-96] 96 (03/05 2202) Resp:  [16-18] 18 (03/06 0515) BP: (106-111)/(65-72) 106/65 (03/05 2202) SpO2:  [99 %] 99 % (03/05 2202)   Gen: A&Ox3, NAD CV: RRR Resp: CTAB Abdomen: soft, NT, ND Uterus: firm, non-tender, below umbilicus Ext: No edema, no calf tenderness bilaterally, SCDs in place  Labs:  Recent Labs    02/28/20 0608  HGB 10.4*    A/P: Pt is a 34 y.o. G1P1001 s/p VAVD, PPD#0  - Pain well controlled -GU: voiding freely -GI: Tolerating general diet -Activity: encouraged sitting up to chair and ambulation as tolerated -Prophylaxis: early ambulation -Labs: stable as above -baby boy circ completed today  DISPO: Continue with routine postpartum care  Myna Hidalgo, DO [phone] (cell) [phone] (office)

## 2020-03-02 ENCOUNTER — Inpatient Hospital Stay (HOSPITAL_COMMUNITY): Admission: AD | Admit: 2020-03-02 | Payer: Managed Care, Other (non HMO) | Source: Home / Self Care

## 2020-03-02 LAB — SURGICAL PATHOLOGY

## 2021-04-30 ENCOUNTER — Other Ambulatory Visit (HOSPITAL_COMMUNITY): Payer: Self-pay

## 2021-04-30 MED ORDER — CEPHALEXIN 500 MG PO CAPS
ORAL_CAPSULE | ORAL | 0 refills | Status: DC
  Filled 2021-04-30 – 2021-05-05 (×2): qty 14, 7d supply, fill #0

## 2021-05-05 ENCOUNTER — Other Ambulatory Visit (HOSPITAL_COMMUNITY): Payer: Self-pay

## 2022-05-20 ENCOUNTER — Other Ambulatory Visit (HOSPITAL_COMMUNITY): Payer: Self-pay

## 2022-05-20 MED ORDER — NORGESTIMATE-ETH ESTRADIOL 0.25-35 MG-MCG PO TABS
ORAL_TABLET | ORAL | 2 refills | Status: DC
  Filled 2022-05-20: qty 28, 28d supply, fill #0
  Filled 2022-06-10 – 2022-06-13 (×2): qty 28, 28d supply, fill #1
  Filled 2022-07-13: qty 28, 28d supply, fill #2

## 2022-05-20 MED ORDER — TRETINOIN 0.025 % EX CREA
TOPICAL_CREAM | CUTANEOUS | 2 refills | Status: DC
  Filled 2022-05-20: qty 45, 30d supply, fill #0

## 2022-05-20 MED ORDER — SPIRONOLACTONE 50 MG PO TABS
ORAL_TABLET | ORAL | 0 refills | Status: DC
  Filled 2022-05-20: qty 30, 30d supply, fill #0

## 2022-05-24 ENCOUNTER — Other Ambulatory Visit (HOSPITAL_COMMUNITY): Payer: Self-pay

## 2022-05-26 ENCOUNTER — Other Ambulatory Visit (HOSPITAL_COMMUNITY): Payer: Self-pay

## 2022-06-06 ENCOUNTER — Other Ambulatory Visit (HOSPITAL_COMMUNITY): Payer: Self-pay

## 2022-06-06 MED ORDER — SPIRONOLACTONE 50 MG PO TABS
50.0000 mg | ORAL_TABLET | Freq: Every day | ORAL | 2 refills | Status: DC
  Filled 2022-06-06: qty 30, 30d supply, fill #0
  Filled 2022-07-13: qty 30, 30d supply, fill #1

## 2022-06-10 ENCOUNTER — Other Ambulatory Visit (HOSPITAL_COMMUNITY): Payer: Self-pay

## 2022-06-13 ENCOUNTER — Other Ambulatory Visit (HOSPITAL_COMMUNITY): Payer: Self-pay

## 2022-07-13 ENCOUNTER — Other Ambulatory Visit (HOSPITAL_COMMUNITY): Payer: Self-pay

## 2022-07-15 ENCOUNTER — Other Ambulatory Visit (HOSPITAL_COMMUNITY): Payer: Self-pay

## 2022-07-15 MED ORDER — SPIRONOLACTONE 50 MG PO TABS
50.0000 mg | ORAL_TABLET | Freq: Two times a day (BID) | ORAL | 1 refills | Status: DC
  Filled 2022-07-15 – 2023-04-19 (×3): qty 60, 30d supply, fill #0

## 2022-08-02 ENCOUNTER — Other Ambulatory Visit (HOSPITAL_COMMUNITY): Payer: Self-pay

## 2022-08-02 MED ORDER — SPIRONOLACTONE 100 MG PO TABS
ORAL_TABLET | ORAL | 0 refills | Status: DC
  Filled 2022-08-02: qty 30, 30d supply, fill #0

## 2022-08-03 ENCOUNTER — Other Ambulatory Visit (HOSPITAL_COMMUNITY): Payer: Self-pay

## 2022-08-09 ENCOUNTER — Other Ambulatory Visit (HOSPITAL_COMMUNITY): Payer: Self-pay

## 2022-08-09 MED ORDER — NORGESTIMATE-ETH ESTRADIOL 0.25-35 MG-MCG PO TABS
ORAL_TABLET | ORAL | 2 refills | Status: DC
  Filled 2022-08-09: qty 28, 28d supply, fill #0
  Filled 2022-09-07: qty 28, 28d supply, fill #1
  Filled 2022-10-04: qty 28, 28d supply, fill #2

## 2022-08-24 ENCOUNTER — Other Ambulatory Visit (HOSPITAL_COMMUNITY): Payer: Self-pay

## 2022-08-24 MED ORDER — TRETINOIN 0.05 % EX CREA
TOPICAL_CREAM | CUTANEOUS | 2 refills | Status: DC
  Filled 2022-08-24 – 2022-09-29 (×2): qty 45, 30d supply, fill #0
  Filled 2023-08-08: qty 45, 30d supply, fill #1

## 2022-08-24 MED ORDER — SPIRONOLACTONE 50 MG PO TABS
ORAL_TABLET | ORAL | 2 refills | Status: DC
  Filled 2022-08-24: qty 90, 30d supply, fill #0
  Filled 2022-10-04: qty 90, 30d supply, fill #1
  Filled 2023-01-21: qty 90, 30d supply, fill #2

## 2022-08-25 ENCOUNTER — Other Ambulatory Visit (HOSPITAL_COMMUNITY): Payer: Self-pay

## 2022-09-07 ENCOUNTER — Other Ambulatory Visit (HOSPITAL_COMMUNITY): Payer: Self-pay

## 2022-09-29 ENCOUNTER — Other Ambulatory Visit (HOSPITAL_COMMUNITY): Payer: Self-pay

## 2022-09-30 ENCOUNTER — Other Ambulatory Visit (HOSPITAL_COMMUNITY)
Admission: RE | Admit: 2022-09-30 | Discharge: 2022-09-30 | Disposition: A | Discharge status: Home / Self Care | Payer: No Typology Code available for payment source | Source: Ambulatory Visit | Attending: Physician Assistant | Admitting: Physician Assistant

## 2022-09-30 DIAGNOSIS — L700 Acne vulgaris: Secondary | ICD-10-CM | POA: Diagnosis present

## 2022-09-30 LAB — POTASSIUM: Potassium: 4.8 mmol/L (ref 3.5–5.1)

## 2022-10-04 ENCOUNTER — Other Ambulatory Visit (HOSPITAL_COMMUNITY): Payer: Self-pay

## 2022-10-04 MED ORDER — SPRINTEC 28 0.25-35 MG-MCG PO TABS
1.0000 | ORAL_TABLET | Freq: Every day | ORAL | 1 refills | Status: DC
  Filled 2022-10-04: qty 112, 112d supply, fill #0
  Filled 2022-10-31: qty 56, 56d supply, fill #0
  Filled 2023-01-21: qty 84, 84d supply, fill #1
  Filled 2023-04-19: qty 28, 28d supply, fill #2

## 2022-10-05 ENCOUNTER — Other Ambulatory Visit (HOSPITAL_COMMUNITY): Payer: Self-pay

## 2022-10-31 ENCOUNTER — Other Ambulatory Visit (HOSPITAL_COMMUNITY): Payer: Self-pay

## 2022-11-02 ENCOUNTER — Other Ambulatory Visit (HOSPITAL_COMMUNITY): Payer: Self-pay

## 2022-12-23 ENCOUNTER — Other Ambulatory Visit (HOSPITAL_COMMUNITY): Payer: Self-pay

## 2022-12-23 MED ORDER — TRETINOIN 0.05 % EX CREA
1.0000 | TOPICAL_CREAM | Freq: Every day | CUTANEOUS | 2 refills | Status: DC
  Filled 2022-12-23: qty 45, 30d supply, fill #0
  Filled 2023-01-21: qty 45, 30d supply, fill #1
  Filled 2023-04-19: qty 45, 30d supply, fill #2

## 2022-12-23 MED ORDER — NORGESTIMATE-ETH ESTRADIOL 0.25-35 MG-MCG PO TABS
1.0000 | ORAL_TABLET | Freq: Every day | ORAL | 11 refills | Status: DC
  Filled 2022-12-23: qty 28, 28d supply, fill #0
  Filled 2023-08-08: qty 84, 84d supply, fill #1
  Filled 2023-11-21: qty 84, 84d supply, fill #2

## 2022-12-30 ENCOUNTER — Other Ambulatory Visit (HOSPITAL_COMMUNITY): Payer: Self-pay

## 2022-12-31 ENCOUNTER — Other Ambulatory Visit (HOSPITAL_COMMUNITY): Payer: Self-pay

## 2023-01-09 ENCOUNTER — Other Ambulatory Visit (HOSPITAL_COMMUNITY): Payer: Self-pay

## 2023-01-21 ENCOUNTER — Other Ambulatory Visit (HOSPITAL_COMMUNITY): Payer: Self-pay

## 2023-01-23 ENCOUNTER — Other Ambulatory Visit (HOSPITAL_COMMUNITY): Payer: Self-pay

## 2023-01-24 ENCOUNTER — Other Ambulatory Visit (HOSPITAL_COMMUNITY): Payer: Self-pay

## 2023-01-27 ENCOUNTER — Other Ambulatory Visit (HOSPITAL_COMMUNITY): Payer: Self-pay

## 2023-02-21 DIAGNOSIS — Z131 Encounter for screening for diabetes mellitus: Secondary | ICD-10-CM | POA: Diagnosis not present

## 2023-02-21 DIAGNOSIS — E559 Vitamin D deficiency, unspecified: Secondary | ICD-10-CM | POA: Diagnosis not present

## 2023-02-21 DIAGNOSIS — Z13220 Encounter for screening for lipoid disorders: Secondary | ICD-10-CM | POA: Diagnosis not present

## 2023-02-21 DIAGNOSIS — Z0000 Encounter for general adult medical examination without abnormal findings: Secondary | ICD-10-CM | POA: Diagnosis not present

## 2023-04-03 DIAGNOSIS — J029 Acute pharyngitis, unspecified: Secondary | ICD-10-CM | POA: Diagnosis not present

## 2023-04-03 DIAGNOSIS — Z03818 Encounter for observation for suspected exposure to other biological agents ruled out: Secondary | ICD-10-CM | POA: Diagnosis not present

## 2023-04-19 ENCOUNTER — Other Ambulatory Visit (HOSPITAL_COMMUNITY): Payer: Self-pay

## 2023-04-19 ENCOUNTER — Other Ambulatory Visit: Payer: Self-pay

## 2023-04-20 ENCOUNTER — Other Ambulatory Visit (HOSPITAL_COMMUNITY): Payer: Self-pay

## 2023-05-16 ENCOUNTER — Other Ambulatory Visit (HOSPITAL_COMMUNITY): Payer: Self-pay

## 2023-05-16 MED ORDER — NORGESTIMATE-ETH ESTRADIOL 0.25-35 MG-MCG PO TABS
1.0000 | ORAL_TABLET | Freq: Every day | ORAL | 0 refills | Status: DC
  Filled 2023-05-16: qty 84, 84d supply, fill #0

## 2023-05-18 ENCOUNTER — Other Ambulatory Visit (HOSPITAL_COMMUNITY): Payer: Self-pay

## 2023-05-18 MED ORDER — SPIRONOLACTONE 50 MG PO TABS
50.0000 mg | ORAL_TABLET | ORAL | 2 refills | Status: DC
  Filled 2023-05-18: qty 90, 30d supply, fill #0
  Filled 2023-08-08: qty 90, 30d supply, fill #1
  Filled 2023-11-21: qty 90, 30d supply, fill #2

## 2023-08-08 ENCOUNTER — Other Ambulatory Visit (HOSPITAL_COMMUNITY): Payer: Self-pay

## 2023-09-29 ENCOUNTER — Other Ambulatory Visit (HOSPITAL_COMMUNITY): Payer: Self-pay

## 2023-09-29 DIAGNOSIS — Z79899 Other long term (current) drug therapy: Secondary | ICD-10-CM | POA: Diagnosis not present

## 2023-09-29 DIAGNOSIS — L810 Postinflammatory hyperpigmentation: Secondary | ICD-10-CM | POA: Diagnosis not present

## 2023-09-29 DIAGNOSIS — L811 Chloasma: Secondary | ICD-10-CM | POA: Diagnosis not present

## 2023-09-29 DIAGNOSIS — D239 Other benign neoplasm of skin, unspecified: Secondary | ICD-10-CM | POA: Diagnosis not present

## 2023-09-29 DIAGNOSIS — L700 Acne vulgaris: Secondary | ICD-10-CM | POA: Diagnosis not present

## 2023-09-29 MED ORDER — SPIRONOLACTONE 50 MG PO TABS
50.0000 mg | ORAL_TABLET | Freq: Two times a day (BID) | ORAL | 1 refills | Status: AC
  Filled 2023-09-29 – 2024-02-21 (×2): qty 180, 90d supply, fill #0

## 2023-10-09 ENCOUNTER — Other Ambulatory Visit (HOSPITAL_COMMUNITY): Payer: Self-pay

## 2023-10-09 DIAGNOSIS — H00014 Hordeolum externum left upper eyelid: Secondary | ICD-10-CM | POA: Diagnosis not present

## 2023-10-09 MED ORDER — TOBRAMYCIN 0.3 % OP SOLN
1.0000 [drp] | OPHTHALMIC | 0 refills | Status: DC
  Filled 2023-10-09: qty 5, 7d supply, fill #0

## 2023-10-19 ENCOUNTER — Other Ambulatory Visit (HOSPITAL_COMMUNITY): Payer: Self-pay

## 2023-10-19 MED ORDER — SCOPOLAMINE 1 MG/3DAYS TD PT72
1.0000 | MEDICATED_PATCH | TRANSDERMAL | 0 refills | Status: DC
  Filled 2023-10-19: qty 5, 15d supply, fill #0

## 2023-10-27 ENCOUNTER — Other Ambulatory Visit (HOSPITAL_COMMUNITY): Payer: Self-pay

## 2023-11-21 ENCOUNTER — Other Ambulatory Visit (HOSPITAL_COMMUNITY): Payer: Self-pay

## 2023-11-22 ENCOUNTER — Other Ambulatory Visit (HOSPITAL_COMMUNITY): Payer: Self-pay

## 2023-12-04 ENCOUNTER — Other Ambulatory Visit (HOSPITAL_COMMUNITY): Payer: Self-pay

## 2023-12-04 MED ORDER — TRETINOIN 0.05 % EX CREA
1.0000 | TOPICAL_CREAM | Freq: Every day | CUTANEOUS | 2 refills | Status: AC
  Filled 2023-12-04 – 2024-07-04 (×3): qty 45, 30d supply, fill #0

## 2023-12-14 ENCOUNTER — Other Ambulatory Visit (HOSPITAL_COMMUNITY): Payer: Self-pay

## 2023-12-25 ENCOUNTER — Encounter: Payer: 59 | Admitting: Obstetrics and Gynecology

## 2024-01-23 ENCOUNTER — Other Ambulatory Visit (HOSPITAL_COMMUNITY)
Admission: RE | Admit: 2024-01-23 | Discharge: 2024-01-23 | Disposition: A | Discharge status: Home / Self Care | Payer: 59 | Source: Ambulatory Visit | Attending: Obstetrics and Gynecology | Admitting: Obstetrics and Gynecology

## 2024-01-23 ENCOUNTER — Other Ambulatory Visit (HOSPITAL_COMMUNITY): Payer: Self-pay

## 2024-01-23 ENCOUNTER — Encounter: Payer: Self-pay | Admitting: Obstetrics and Gynecology

## 2024-01-23 ENCOUNTER — Ambulatory Visit (INDEPENDENT_AMBULATORY_CARE_PROVIDER_SITE_OTHER): Payer: 59 | Admitting: Obstetrics and Gynecology

## 2024-01-23 VITALS — BP 118/74 | HR 70 | Resp 16 | Ht 66.75 in | Wt 156.0 lb

## 2024-01-23 DIAGNOSIS — Z1331 Encounter for screening for depression: Secondary | ICD-10-CM | POA: Diagnosis not present

## 2024-01-23 DIAGNOSIS — Z01419 Encounter for gynecological examination (general) (routine) without abnormal findings: Secondary | ICD-10-CM

## 2024-01-23 MED ORDER — NORGESTIMATE-ETH ESTRADIOL 0.25-35 MG-MCG PO TABS
1.0000 | ORAL_TABLET | Freq: Every day | ORAL | 6 refills | Status: AC
Start: 2024-01-23 — End: ?
  Filled 2024-01-23 – 2024-02-21 (×2): qty 84, 84d supply, fill #0

## 2024-01-23 NOTE — Addendum Note (Signed)
Addended by: Earley Favor on: 01/23/2024 04:52 PM   Modules accepted: Orders

## 2024-01-23 NOTE — Progress Notes (Signed)
38 y.o. y.o. female here for annual exam. Patient's last menstrual period was 12/27/2023 (exact date). Period Duration (Days): 7 Period Pattern: Regular Menstrual Flow: Moderate Menstrual Control: Maxi pad, Tampon Dysmenorrhea: (!) Moderate (back pain) Dysmenorrhea Symptoms: Cramping, Headache, Diarrhea, Nausea On ocp's for acne and while on spirolactone.   MMG: not done Colonoscopy: none Pap smear hx: no prior abnormal Svd 4 years ago baby boy.  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     01/23/2024    2:23 PM  Depression screen PHQ 2/9  Decreased Interest 0  Down, Depressed, Hopeless 0  PHQ - 2 Score 0    Height 5' 6.75" (1.695 m), weight 156 lb (70.8 kg), last menstrual period 12/27/2023, unknown if currently breastfeeding.  No results found for: "DIAGPAP", "HPVHIGH", "ADEQPAP"  GYN HISTORY: No results found for: "DIAGPAP", "HPVHIGH", "ADEQPAP"  OB History  Gravida Para Term Preterm AB Living  1 1 1   1   SAB IAB Ectopic Multiple Live Births     0 1    # Outcome Date GA Lbr Len/2nd Weight Sex Type Anes PTL Lv  1 Term 02/27/20 [redacted]w[redacted]d / 02:31 6 lb 4.9 oz (2.86 kg) M Vag-Vacuum EPI  LIV    Past Medical History:  Diagnosis Date   Paroxysmal tachycardia (HCC) 03/12/2018   Shortness of breath 05/03/2018    History reviewed. No pertinent surgical history.  Current Outpatient Medications on File Prior to Visit  Medication Sig Dispense Refill   acetaminophen (TYLENOL) 500 MG tablet Take 500 mg by mouth every 6 (six) hours as needed for mild pain or headache.     Cholecalciferol (VITAMIN D3 PO) Take 2,000 Int'l Units by mouth.     ibuprofen (ADVIL) 600 MG tablet Take 1 tablet (600 mg total) by mouth every 6 (six) hours. 30 tablet 0   Multiple Vitamin (MULTIVITAMIN PO) Take by mouth.     norgestimate-ethinyl estradiol (SPRINTEC 28) 0.25-35 MG-MCG tablet Take 1 tablet by mouth daily. 28 tablet 11   spironolactone (ALDACTONE) 50 MG tablet Take 1  tablet (50 mg total) by mouth 2 (two) times daily. 180 tablet 1   tretinoin (RETIN-A) 0.05 % cream Apply 1 Application topically at bedtime. 45 g 2   No current facility-administered medications on file prior to visit.    Social History   Socioeconomic History   Marital status: Married    Spouse name: Not on file   Number of children: Not on file   Years of education: Not on file   Highest education level: Not on file  Occupational History   Not on file  Tobacco Use   Smoking status: Never   Smokeless tobacco: Never  Vaping Use   Vaping status: Never Used  Substance and Sexual Activity   Alcohol use: No   Drug use: No   Sexual activity: Yes    Partners: Male    Birth control/protection: Condom, OCP  Other Topics Concern   Not on file  Social History Narrative   Not on file   Social Drivers of Health   Financial Resource Strain: Not on file  Food Insecurity: Not on file  Transportation Needs: Not on file  Physical Activity: Not on file  Stress: Not on file  Social Connections: Unknown (05/10/2022)   Received from [facility]   Social Network    Social Network: Not on file  Intimate Partner Violence: Unknown (03/31/2022)   Received from [facility]  HITS    Physically Hurt: Not on file    Insult or Talk Down To: Not on file    Threaten Physical Harm: Not on file    Scream or Curse: Not on file    Family History  Problem Relation Age of Onset   Hypertension Mother    Diabetes Mother    Hypertension Father    Other Father        kidney tumor   Diabetes Mellitus I Sister    Valvular heart disease Sister    Heart disease Brother    Leukemia Paternal Aunt    Diabetes Maternal Grandmother      No Known Allergies    Patient's last menstrual period was Patient's last menstrual period was 12/27/2023 (exact date)..            Review of Systems Alls systems reviewed and are negative.     Physical Exam Constitutional:      Appearance: Normal  appearance.  Genitourinary:     Vulva and urethral meatus normal.     No lesions in the vagina.     Right Labia: No rash, lesions or skin changes.    Left Labia: No lesions, skin changes or rash.    No vaginal discharge or tenderness.     No vaginal prolapse present.    No vaginal atrophy present.     Right Adnexa: not tender, not palpable and no mass present.    Left Adnexa: not tender, not palpable and no mass present.    No cervical motion tenderness or discharge.     Uterus is not enlarged, tender or irregular.  Breasts:    Right: Normal.     Left: Normal.  HENT:     Head: Normocephalic.  Neck:     Thyroid: No thyroid mass, thyromegaly or thyroid tenderness.  Cardiovascular:     Rate and Rhythm: Normal rate and regular rhythm.     Heart sounds: Normal heart sounds, S1 normal and S2 normal.  Pulmonary:     Effort: Pulmonary effort is normal.     Breath sounds: Normal breath sounds and air entry.  Abdominal:     General: There is no distension.     Palpations: Abdomen is soft. There is no mass.     Tenderness: There is no abdominal tenderness. There is no guarding or rebound.  Musculoskeletal:        General: Normal range of motion.     Cervical back: Full passive range of motion without pain, normal range of motion and neck supple. No tenderness.     Right lower leg: No edema.     Left lower leg: No edema.  Neurological:     Mental Status: She is alert.  Skin:    General: Skin is warm.  Psychiatric:        Mood and Affect: Mood normal.        Behavior: Behavior normal.        Thought Content: Thought content normal.  Vitals and nursing note reviewed. Exam conducted with a chaperone present.       A:         Well Woman GYN exam                             P:        Pap smear collected today Encouraged annual mammogram screening Colon cancer screening not indicated DXA not indicated Labs  and immunizations to do with PMD Discussed breast self  exams Encouraged healthy lifestyle practices Encouraged Vit D and Calcium   No follow-ups on file.  Earley Favor

## 2024-01-26 ENCOUNTER — Encounter: Payer: Self-pay | Admitting: Obstetrics and Gynecology

## 2024-01-26 LAB — CYTOLOGY - PAP
Comment: NEGATIVE
Diagnosis: NEGATIVE
High risk HPV: NEGATIVE

## 2024-02-21 ENCOUNTER — Other Ambulatory Visit (HOSPITAL_COMMUNITY): Payer: Self-pay

## 2024-02-22 ENCOUNTER — Other Ambulatory Visit (HOSPITAL_COMMUNITY): Payer: Self-pay

## 2024-02-29 DIAGNOSIS — Z0000 Encounter for general adult medical examination without abnormal findings: Secondary | ICD-10-CM | POA: Diagnosis not present

## 2024-02-29 DIAGNOSIS — D72819 Decreased white blood cell count, unspecified: Secondary | ICD-10-CM | POA: Diagnosis not present

## 2024-02-29 DIAGNOSIS — Z131 Encounter for screening for diabetes mellitus: Secondary | ICD-10-CM | POA: Diagnosis not present

## 2024-02-29 DIAGNOSIS — E785 Hyperlipidemia, unspecified: Secondary | ICD-10-CM | POA: Diagnosis not present

## 2024-02-29 DIAGNOSIS — E559 Vitamin D deficiency, unspecified: Secondary | ICD-10-CM | POA: Diagnosis not present

## 2024-04-02 ENCOUNTER — Encounter: Payer: Self-pay | Admitting: Obstetrics and Gynecology

## 2024-04-03 NOTE — Telephone Encounter (Signed)
 AEX 01/23/24  R. Boswell -please review and advise

## 2024-05-09 ENCOUNTER — Other Ambulatory Visit (HOSPITAL_COMMUNITY): Payer: Self-pay

## 2024-05-09 DIAGNOSIS — L700 Acne vulgaris: Secondary | ICD-10-CM | POA: Diagnosis not present

## 2024-05-09 DIAGNOSIS — Z79899 Other long term (current) drug therapy: Secondary | ICD-10-CM | POA: Diagnosis not present

## 2024-05-09 MED ORDER — SPIRONOLACTONE 50 MG PO TABS
75.0000 mg | ORAL_TABLET | Freq: Every day | ORAL | 5 refills | Status: AC
  Filled 2024-05-09 – 2024-07-04 (×2): qty 45, 30d supply, fill #0
  Filled 2024-09-03: qty 45, 30d supply, fill #1
  Filled 2025-01-30: qty 45, 30d supply, fill #2

## 2024-05-21 ENCOUNTER — Other Ambulatory Visit (HOSPITAL_COMMUNITY): Payer: Self-pay

## 2024-07-04 ENCOUNTER — Other Ambulatory Visit (HOSPITAL_COMMUNITY): Payer: Self-pay

## 2024-07-05 ENCOUNTER — Other Ambulatory Visit (HOSPITAL_COMMUNITY): Payer: Self-pay

## 2024-09-03 ENCOUNTER — Other Ambulatory Visit (HOSPITAL_COMMUNITY): Payer: Self-pay

## 2024-10-21 ENCOUNTER — Other Ambulatory Visit (HOSPITAL_COMMUNITY): Payer: Self-pay

## 2025-01-23 ENCOUNTER — Ambulatory Visit: Payer: 59 | Admitting: Obstetrics and Gynecology

## 2025-05-09 ENCOUNTER — Ambulatory Visit: Admitting: Obstetrics and Gynecology
